# Patient Record
Sex: Male | Born: 1935 | Race: White | Hispanic: No | Smoking: Never smoker
Health system: Southern US, Community
[De-identification: ages and names within clinical notes are randomized; demographics above are authoritative.]

## PROBLEM LIST (undated history)

## (undated) DIAGNOSIS — I509 Heart failure, unspecified: Secondary | ICD-10-CM

## (undated) DIAGNOSIS — I10 Essential (primary) hypertension: Secondary | ICD-10-CM

## (undated) HISTORY — PX: VALVE REPLACEMENT: SUR13

## (undated) HISTORY — PX: CARDIAC SURGERY: SHX584

## (undated) SURGERY — ECHOCARDIOGRAM, TRANSESOPHAGEAL
Anesthesia: Moderate Sedation | Laterality: Right

---

## 2010-06-01 ENCOUNTER — Emergency Department: Payer: Self-pay | Admitting: Emergency Medicine

## 2010-12-28 ENCOUNTER — Ambulatory Visit: Payer: Self-pay | Admitting: Family Medicine

## 2014-01-26 ENCOUNTER — Observation Stay: Payer: Self-pay | Admitting: Family Medicine

## 2014-01-26 LAB — CBC WITH DIFFERENTIAL/PLATELET
BASOS ABS: 0.1 10*3/uL (ref 0.0–0.1)
Basophil %: 1 %
EOS PCT: 4.4 %
Eosinophil #: 0.3 10*3/uL (ref 0.0–0.7)
HCT: 35.8 % — ABNORMAL LOW (ref 40.0–52.0)
HGB: 11.5 g/dL — ABNORMAL LOW (ref 13.0–18.0)
Lymphocyte #: 1.8 10*3/uL (ref 1.0–3.6)
Lymphocyte %: 30.4 %
MCH: 27.7 pg (ref 26.0–34.0)
MCHC: 32.3 g/dL (ref 32.0–36.0)
MCV: 86 fL (ref 80–100)
Monocyte #: 0.6 x10 3/mm (ref 0.2–1.0)
Monocyte %: 10.2 %
Neutrophil #: 3.2 10*3/uL (ref 1.4–6.5)
Neutrophil %: 54 %
PLATELETS: 186 10*3/uL (ref 150–440)
RBC: 4.16 10*6/uL — ABNORMAL LOW (ref 4.40–5.90)
RDW: 14.6 % — ABNORMAL HIGH (ref 11.5–14.5)
WBC: 5.9 10*3/uL (ref 3.8–10.6)

## 2014-01-26 LAB — COMPREHENSIVE METABOLIC PANEL
ALK PHOS: 44 U/L — AB
ALT: 24 U/L (ref 12–78)
ANION GAP: 4 — AB (ref 7–16)
AST: 25 U/L (ref 15–37)
Albumin: 3.7 g/dL (ref 3.4–5.0)
BUN: 28 mg/dL — AB (ref 7–18)
Bilirubin,Total: 0.3 mg/dL (ref 0.2–1.0)
CALCIUM: 8.7 mg/dL (ref 8.5–10.1)
Chloride: 105 mmol/L (ref 98–107)
Co2: 27 mmol/L (ref 21–32)
Creatinine: 1.18 mg/dL (ref 0.60–1.30)
EGFR (Non-African Amer.): 56 — ABNORMAL LOW
Glucose: 92 mg/dL (ref 65–99)
Osmolality: 277 (ref 275–301)
POTASSIUM: 4 mmol/L (ref 3.5–5.1)
SODIUM: 136 mmol/L (ref 136–145)
Total Protein: 7.4 g/dL (ref 6.4–8.2)

## 2014-01-26 LAB — URINALYSIS, COMPLETE
Bacteria: NONE SEEN
Bilirubin,UR: NEGATIVE
Blood: NEGATIVE
Glucose,UR: NEGATIVE mg/dL (ref 0–75)
Hyaline Cast: 3
Ketone: NEGATIVE
Leukocyte Esterase: NEGATIVE
Nitrite: NEGATIVE
Ph: 6 (ref 4.5–8.0)
Protein: NEGATIVE
SPECIFIC GRAVITY: 1.014 (ref 1.003–1.030)
SQUAMOUS EPITHELIAL: NONE SEEN

## 2014-01-26 LAB — PROTIME-INR
INR: 2.5
Prothrombin Time: 26.3 secs — ABNORMAL HIGH (ref 11.5–14.7)

## 2014-01-26 LAB — CK TOTAL AND CKMB (NOT AT ARMC)
CK, TOTAL: 321 U/L — AB
CK, Total: 370 U/L — ABNORMAL HIGH
CK, Total: 366 U/L — ABNORMAL HIGH
CK-MB: 12.2 ng/mL — AB (ref 0.5–3.6)
CK-MB: 11.8 ng/mL — AB (ref 0.5–3.6)
CK-MB: 10.4 ng/mL — AB (ref 0.5–3.6)

## 2014-01-26 LAB — TROPONIN I
TROPONIN-I: 0.02 ng/mL
Troponin-I: <0.02 ng/mL
Troponin-I: <0.02 ng/mL

## 2015-03-05 NOTE — Discharge Summary (Signed)
PATIENT NAME:  Dean Cruz, Dean Cruz MR#:  [id number] DATE OF BIRTH:  06-Jan-1927  DATE OF ADMISSION:  01/26/2014 DATE OF DISCHARGE:  01/26/2014  REASON FOR ADMISSION: Chest pain.  ADMISSION DIAGNOSES:  1. Angina. 2. Hypertension. 3. Hyperlipidemia  4. Benign prostatic hypertrophy. 5. Mitral valve replacement. 6. Coronary artery disease, status post stent in 2002. 7. Former smoker. 8. Hyperthyroidism.  DISPOSITION: Home.  MEDICATIONS AT DISCHARGE: , aspirin 81 mg daily, lisinopril 40 mg daily, warfarin 2.5 mg daily,  fish oil 100 mg twice daily, Synthroid 100 mcg once daily, ascorbic acid 100 mg once daily.    HOSPITAL COURSE:  This is a very nice 81 year old gentleman with history of coronary artery disease, hypertension, hyperlipidemia, BPH, previous mitral valve replacement in 2008 on chronic anticoagulation, presented to the emergency department with a history of chest pain associated with shortness of breath and diaphoresis. The patient has been having some pressure on his chest on and off   for the past 2 weeks. The patient has significant pressure in his chest when he works. He was admitted to the emergency department.  The patient was evaluated by Dr. Welton FlakesKhan who decided to observe with cardiac enzimes  and do a stress test as a outpatient.  The patient had 2 sets of cardiac enzymes that were negative, and overall his laboratory was in normal range. His INR was 2.5. His hemoglobin was 11.5.  His white blood cells were 5.9.  His BUN was 28, creatinine 1.18.  The patient was discharged in good condition with general recommendations.  Prescription of nitroglycerin given to the patient.  I spent about 45 minutes evaluating the patient and getting him discharged.  ____________________________ Felipa Furnaceoberto Sanchez Gutierrez, MD rsg:sg D: 01/30/2014 23:25:00 ET T: 01/31/2014 09:29:47 ET JOB#: [id number]  cc: Felipa Furnaceoberto Sanchez Gutierrez, MD, <Dictator> Addison Freimuth Juanda ChanceSANCHEZ GUTIERRE MD ELECTRONICALLY  SIGNED 02/07/2014 13:31

## 2015-03-05 NOTE — Consult Note (Signed)
PATIENT NAME:  Dean Cruz, Dean MR#:  [id number] DATE OF BIRTH:  07-Sep-1927  CARDIOLOGY CONSULTATION   DATE OF CONSULTATION:  01/26/2014  CONSULTING PHYSICIAN:  Laurier NancyShaukat A. Janai Maudlin, MD  INDICATION FOR THE CONSULTATION: Chest pain.   HISTORY OF PRESENT ILLNESS: This is an 81 year old pleasant white male with a past medical history of coronary artery disease, status post PCI x2, history of having that done in 2002, and then had mitral valve replacement in 2008 also, who presented to the Emergency Room with pressure-type chest pain associated with shortness of breath and diaphoresis. He says that he has been having this pressure in his chest intermittently for the past couple of weeks. Whenever he goes to the bathroom, he gets pressure, and walking just within the house, he gets pressure in the chest. He was a little concerned when it would not go away, and that is why he came into the Emergency Room last night.    OTHER PAST MEDICAL HISTORY: History of hypertension, hyperlipidemia, BPH. As mentioned, had PCI and stenting in 2002 and mitral valve replacement in 2008.   ALLERGIES: None.   FAMILY HISTORY: His father had MI at age 79.   MEDICATIONS:  1. Aspirin 81 mg.  2. Lisinopril 40 mg.  3. Warfarin 2.5 mg.  4. Hydrochlorothiazide 25 mg.  5. Synthroid.  6. Ascorbic acid.   SOCIAL HISTORY: Denied EtOH abuse or smoking.   PHYSICAL EXAMINATION:  GENERAL: His pulse was 56, respirations were 19, blood pressure was 168/79.  NECK: Revealed no JVD.  LUNGS: Clear.  HEART: Regular rate and rhythm. Normal S1, S2. No audible murmur.  ABDOMEN: Soft, nontender. Positive bowel sounds.  EXTREMITIES: No pedal edema.  NEUROLOGICAL: Appears to be intact.   LABORATORY DATA: His INR was 2.5. His troponins both sets were negative, but CPK was slightly elevated. CPK was 366 and 370 and MB fraction was 12.2 and 11.8; however, troponins were both negative. BUN and creatinine is 28/1.18. The rest of the  electrolytes look normal.   ASSESSMENT AND PLAN: Appears like anginal type of chest pain. EKG had no acute changes. [facility]. He actually had [facility] and PCI and stent[facility] at the [facility], but lives in [place], and because of closeness to the hospital, he came here. He has ruled out for myocardial infarction except for the last set of cardiac enzymes. Advise discharging after the third set, and will set him up for a stress Cardiolite tomorrow in the office at 8:30.   Thank you very much for referral.   ____________________________ Laurier NancyShaukat A. Shamar Kracke, MD sak:lb D: 01/26/2014 08:34:22 ET T: 01/26/2014 08:53:59 ET JOB#: [id number]  cc: Laurier NancyShaukat A. Evellyn Tuff, MD, <Dictator> Laurier NancySHAUKAT A Telisha Zawadzki MD ELECTRONICALLY SIGNED 02/24/2014 13:56

## 2015-03-05 NOTE — H&P (Signed)
PATIENT NAME:  Dean Cruz, Dean Cruz MR#:  [id number] DATE OF BIRTH:  1927/01/06  DATE OF ADMISSION:  01/26/2014  REFERRING PHYSICIAN: Marjean Donna, MD  PRIMARY CARE PHYSICIAN: The patient is following with [place]: Chest pain.   HISTORY OF PRESENT ILLNESS: This is an 81 year old male with known history of coronary artery disease status post cardiac stent in 7416 and metallic mitral valve replacement in 2008 who presents with complaint of chest pain. Reports his pain has been intermittent over the last week, is referred as midsternal, pressure quality, and nonradiating provoked by exertion, relieved by rest. He denies any nausea, vomiting, diaphoresis, shortness of breath, palpitation or dizziness accompanying the chest pain. Denies any such previous chest pain in the past. Currently denies any chest pain. The patient's first troponin was negative. The patient's EKG did show T wave inversion in the anterior lateral leads, but we do not have a baseline for his EKG as this is his first time visit. As well, the patient is known to have mitral metallic valve replacement. He is on warfarin with therapeutic INR of 2.5. So far the patient had 2 negative troponins, less than 0.02.   PAST MEDICAL HISTORY: 1.  Hypertension.  2.  Hyperlipidemia.  3.  BPH. 4.  Coronary artery disease status post cardiac stent in 2002.  5.  Mitral valve replacement in 2008.   FAMILY HISTORY: He quit smoking more than 50 years ago. No alcohol. No illicit drug use. Denies any family history of coronary artery disease at a young age. Reports his father had a heart attack at age of 16.   ALLERGIES: No known drug allergies.   HOME MEDICATIONS: 1.  Finasteride 5 mg oral daily. 2.  Aspirin 81 mg oral daily.  3.  Lisinopril 40 mg oral daily.  4.  Warfarin 2.5 mg oral daily.  5.  Hydrochlorothiazide 25 mg oral daily. 6.  Fish oil 1000 mg oral 2 times a day.  7.  Synthroid 1000 mcg oral daily.  8.   Ascorbic acid 1000 mg oral daily.   REVIEW OF SYSTEMS: CONSTITUTIONAL: Denies fever, chills, fatigue, weakness.  EYES: Denies blurry vision, double vision, inflammation, glaucoma, tinnitus, ear pain, hearing loss, epistaxis or discharge.  RESPIRATORY: Denies cough, wheezing, hemoptysis, dyspnea, COPD. CARDIOVASCULAR: Reports chest pain, currently resolved. No edema. No palpitation. No syncope.  GASTROINTESTINAL: Denies nausea, vomiting, diarrhea, abdominal pain, hematemesis, melena.  GENITOURINARY: Denies dysuria, hematuria, or renal colic.  ENDOCRINE: Denies polyuria, polydipsia, heat or cold intolerance.  HEMATOLOGY: Denies anemia, easy bruising, bleeding diathesis.  INTEGUMENT: Denies acne, rash or skin lesion.  MUSCULOSKELETAL: Denies any neck pain, arthritis, cramps, swelling, gout.  NEUROLOGIC: Denies CVA, TIA, dementia, headache, ataxia, vertigo.  PSYCHIATRIC: Denies anxiety, insomnia, bipolar disorder or schizophrenia.   PHYSICAL EXAMINATION: VITAL SIGNS: Temperature 97.9, pulse 44, respiratory rate 14, blood pressure 144/70, saturating 97% on room air.  GENERAL: Well-nourished male who looks comfortable in bed, in no apparent distress.  HEENT: Head atraumatic, normocephalic. Pupils equal and reactive to light. Pink conjunctivae. Anicteric sclerae. Moist oral mucosa.  NECK: Supple. No thyromegaly. No JVD.  CHEST: Good air entry bilaterally. No wheezing, rales, rhonchi.  CARDIOVASCULAR: S1, S2 heard. No rubs. Has midsystolic click.  ABDOMEN: Soft, nontender, nondistended. Bowel sounds present.  EXTREMITIES: No edema. No clubbing. No cyanosis. Pedal and radial pulses are +2 bilaterally.  PSYCHIATRIC: Appropriate affect. Awake and alert x3. Intact judgment and insight.  MUSCULOSKELETAL: No joint effusion or erythema.  SKIN: Normal  skin turgor. Warm and dry.   DIAGNOSTIC DATA: Pertinent labs: Glucose 92, BUN 28, creatinine 1.18, sodium 136, potassium 4, chloride 105, CO2 27, ALT 24,  AST 25, alk phos 44. Troponin less than 0.02 x2. White blood cell 5.9, hemoglobin 11.5, hematocrit 35.8, platelets 186,000. INR 2.5. Urinalysis negative for leukocyte esterase and nitrite.   Chest x-ray: No acute disease.   ASSESSMENT AND PLAN: 1.  Chest pain, currently resolved. The patient is known to have history of coronary artery disease so he will be admitted to telemetry floor for further evaluation. So far he has 2 sets of negative cardiac enzymes. We will obtain a total of 3 sets. The patient has EKG changes, but unclear if these are old or new. Will request his record from [facility]. As well, we will consult cardiology service for further evaluation and see if there is any further work-up indicated at this point. He was already given 324 mg of aspirin in the ED. He is already on anticoagulation with therapeutic INR of 2.5.  2.  Hypertension. Blood pressure acceptable. Continue with home medication.  3.  Hyperlipidemia. Continue with fish oil.  4.  Benign prostatic hypertrophy. Continue with finasteride.  5.  Metallic mitral valve. The patient is on warfarin with therapeutic INR.  6.  We will consult pharmacy to dose his warfarin.  7.  Deep vein thrombosis prophylaxis. The patient has full dose anticoagulation with warfarin.   CODE STATUS: The patient reports he is a FULL code.   TOTAL TIME SPENT ON ADMISSION AND PATIENT CARE: 55 minutes.   ____________________________ Albertine Patricia, MD dse:sb D: 01/26/2014 07:21:34 ET T: 01/26/2014 08:11:31 ET JOB#: [id number]  cc: Albertine Patricia, MD, <Dictator> Destony Prevost Graciela Husbands MD ELECTRONICALLY SIGNED 02/01/2014 1:39

## 2017-11-26 ENCOUNTER — Encounter: Payer: Self-pay | Admitting: Emergency Medicine

## 2017-11-26 ENCOUNTER — Other Ambulatory Visit: Payer: Self-pay

## 2017-11-26 ENCOUNTER — Emergency Department: Payer: Medicare Other

## 2017-11-26 ENCOUNTER — Emergency Department
Admission: EM | Admit: 2017-11-26 | Discharge: 2017-11-26 | Disposition: A | Discharge status: Home / Self Care | Payer: Medicare Other | Attending: Emergency Medicine | Admitting: Emergency Medicine

## 2017-11-26 DIAGNOSIS — R531 Weakness: Secondary | ICD-10-CM | POA: Diagnosis not present

## 2017-11-26 DIAGNOSIS — I110 Hypertensive heart disease with heart failure: Secondary | ICD-10-CM | POA: Diagnosis not present

## 2017-11-26 DIAGNOSIS — R110 Nausea: Secondary | ICD-10-CM | POA: Insufficient documentation

## 2017-11-26 DIAGNOSIS — Z79899 Other long term (current) drug therapy: Secondary | ICD-10-CM | POA: Diagnosis not present

## 2017-11-26 DIAGNOSIS — R42 Dizziness and giddiness: Secondary | ICD-10-CM | POA: Diagnosis present

## 2017-11-26 DIAGNOSIS — I509 Heart failure, unspecified: Secondary | ICD-10-CM | POA: Insufficient documentation

## 2017-11-26 DIAGNOSIS — E860 Dehydration: Secondary | ICD-10-CM

## 2017-11-26 DIAGNOSIS — Z954 Presence of other heart-valve replacement: Secondary | ICD-10-CM | POA: Diagnosis not present

## 2017-11-26 DIAGNOSIS — Z7901 Long term (current) use of anticoagulants: Secondary | ICD-10-CM | POA: Diagnosis not present

## 2017-11-26 DIAGNOSIS — R860 Abnormal level of enzymes in specimens from male genital organs: Secondary | ICD-10-CM | POA: Diagnosis not present

## 2017-11-26 HISTORY — DX: Essential (primary) hypertension: I10

## 2017-11-26 HISTORY — DX: Heart failure, unspecified: I50.9

## 2017-11-26 LAB — BASIC METABOLIC PANEL
ANION GAP: 6 (ref 5–15)
BUN: 32 mg/dL — ABNORMAL HIGH (ref 6–20)
CO2: 24 mmol/L (ref 22–32)
CREATININE: 1.2 mg/dL (ref 0.61–1.24)
Calcium: 8.4 mg/dL — ABNORMAL LOW (ref 8.9–10.3)
Chloride: 108 mmol/L (ref 101–111)
GFR, EST NON AFRICAN AMERICAN: 52 mL/min — AB (ref >60)
Glucose, Bld: 126 mg/dL — ABNORMAL HIGH (ref 65–99)
Potassium: 4.2 mmol/L (ref 3.5–5.1)
Sodium: 138 mmol/L (ref 135–145)

## 2017-11-26 LAB — CBC
HCT: 34.2 % — ABNORMAL LOW (ref 40.0–52.0)
HEMOGLOBIN: 11.7 g/dL — AB (ref 13.0–18.0)
MCH: 30.8 pg (ref 26.0–34.0)
MCHC: 34 g/dL (ref 32.0–36.0)
MCV: 90.6 fL (ref 80.0–100.0)
PLATELETS: 196 10*3/uL (ref 150–440)
RBC: 3.78 MIL/uL — AB (ref 4.40–5.90)
RDW: 14.7 % — ABNORMAL HIGH (ref 11.5–14.5)
WBC: 7.9 10*3/uL (ref 3.8–10.6)

## 2017-11-26 LAB — TROPONIN I

## 2017-11-26 MED ORDER — SODIUM CHLORIDE 0.9 % IV BOLUS (SEPSIS)
500.0000 mL | Freq: Once | INTRAVENOUS | Status: AC
  Administered 2017-11-26: 500 mL via INTRAVENOUS

## 2017-11-26 NOTE — ED Notes (Signed)
Pt was able to successfully urinate Sent to lab  Lm edt

## 2017-11-26 NOTE — ED Notes (Signed)
ED Provider at bedside. 

## 2017-11-26 NOTE — ED Triage Notes (Signed)
Pt bib [facility] from home where he klives with daughter. Pt reports waking approx 0130, but feeing weak and dizzy upon standing. Report from EMS is that pt and daughter drove home from [place] today, ate tuna for dinner, no nausea present until waking during the night.

## 2017-11-26 NOTE — ED Provider Notes (Signed)
[facility] Emergency Department Provider Note    First MD Initiated Contact with Patient 11/26/17 [phone]     (approximate)  I have reviewed the triage vital signs and the nursing notes.   HISTORY  Chief Complaint Weakness and Nausea   HPI Dean Cruz is a 84 y.o. male with below list of chronic medical conditions including mitral valve replacement CHF and hypertension presents to the emergency department with acute onset of dizziness on standing tonight.  Patient states that he stood up to go to the bathroom and acutely became dizzy with generalized weakness.  Patient denies any vomiting or diarrhea.  Patient denies any chest pain no shortness of breath.  Patient states that symptoms have resolved at this time.  Patient states only liquid ingestion yesterday was coffee times 4 cups.    Past Medical History:  Diagnosis Date  . CHF (congestive heart failure) (HCC)   . Hypertension     There are no active problems to display for this patient.   Past Surgical History:  Procedure Laterality Date  . CARDIAC SURGERY    . VALVE REPLACEMENT      Prior to Admission medications   Medication Sig Start Date End Date Taking? Authorizing Provider  amLODipine (NORVASC) 10 MG tablet Take 5 mg by mouth daily.   Yes [provider]  ferrous sulfate 325 (65 FE) MG EC tablet Take 324 mg by mouth daily with breakfast.   Yes [provider]  finasteride (PROSCAR) 5 MG tablet Take 5 mg by mouth daily.   Yes [provider]  levothyroxine (SYNTHROID, LEVOTHROID) 112 MCG tablet Take 112 mcg by mouth daily before breakfast.   Yes [provider]  lisinopril (PRINIVIL,ZESTRIL) 40 MG tablet Take 40 mg by mouth daily.   Yes [provider]  metoprolol succinate (TOPROL-XL) 25 MG 24 hr tablet Take 25 mg by mouth daily.   Yes [provider]  rosuvastatin (CRESTOR) 40 MG tablet Take 40 mg by mouth at bedtime.   Yes  [provider]  warfarin (COUMADIN) 2.5 MG tablet Take 2.5 mg by mouth daily. Take one tablet by mouth every day, except take one-half tablet Wednesday and Friday   Yes [provider]    Allergies No known drug allergies No family history on file.  Social History Social History   Tobacco Use  . Smoking status: Never Smoker  . Smokeless tobacco: Never Used  Substance Use Topics  . Alcohol use: No    Frequency: Never  . Drug use: No    Review of Systems Constitutional: No fever/chills Eyes: No visual changes. ENT: No sore throat. Cardiovascular: Denies chest pain. Respiratory: Denies shortness of breath. Gastrointestinal: No abdominal pain.  No nausea, no vomiting.  No diarrhea.  No constipation. Genitourinary: Negative for dysuria. Musculoskeletal: Negative for neck pain.  Negative for back pain. Integumentary: Negative for rash. Neurological: Negative for headaches, focal weakness or numbness.  Positive for dizziness with position change  ____________________________________________   PHYSICAL EXAM:  VITAL SIGNS: ED Triage Vitals  Enc Vitals Group     BP 11/26/17 0318 (!) 184/78     Pulse Rate 11/26/17 0318 (!) 55     Resp 11/26/17 0318 14     Temp 11/26/17 0318 97.6 F (36.4 C)     Temp Source 11/26/17 0318 Oral     SpO2 11/26/17 0314 98 %     Weight 11/26/17 0319 81.6 kg (180 lb)     Height  11/26/17 0319 1.676 m (5\' 6" )     Head Circumference --      Peak Flow --      Pain Score --      Pain Loc --      Pain Edu? --      Excl. in GC? --     Constitutional: Alert and oriented. Well appearing and in no acute distress. Eyes: Conjunctivae are normal. PERRL. EOMI. Head: Atraumatic. Mouth/Throat: Mucous membranes are moist.  Oropharynx non-erythematous. Neck: No stridor.   Cardiovascular: Normal rate, regular rhythm. Good peripheral circulation. Grossly normal heart sounds. Respiratory: Normal respiratory effort.  No retractions. Lungs  CTAB. Gastrointestinal: Soft and nontender. No distention.  Musculoskeletal: No lower extremity tenderness nor edema. No gross deformities of extremities. Neurologic:  Normal speech and language. No gross focal neurologic deficits are appreciated.  Skin:  Skin is warm, dry and intact. No rash noted. Psychiatric: Mood and affect are normal. Speech and behavior are normal.  ____________________________________________   LABS (all labs ordered are listed, but only abnormal results are displayed)  Labs Reviewed  BASIC METABOLIC PANEL - Abnormal; Notable for the following components:      Result Value   Glucose, Bld 126 (*)    BUN 32 (*)    Calcium 8.4 (*)    GFR calc non Af Amer 52 (*)    All other components within normal limits  CBC - Abnormal; Notable for the following components:   RBC 3.78 (*)    Hemoglobin 11.7 (*)    HCT 34.2 (*)    RDW 14.7 (*)    All other components within normal limits  TROPONIN I   ____________________________________________  EKG  ED ECG REPORT I, [place] N Maki Hege, the attending physician, personally viewed and interpreted this ECG.   Date: 11/26/2017  EKG Time: 3:14 AM  Rate: 58  Rhythm: Sinus bradycardia with premature ventricular contraction  Axis: Normal  Intervals: Normal  ST&T Change: None  ____________________________________________  RADIOLOGY I, [place] N Nikkita Adeyemi, personally viewed and evaluated these images (plain radiographs) as part of my medical decision making, as well as reviewing the written report by the radiologist.  Ct Head Wo Contrast  Result Date: 11/26/2017 CLINICAL DATA:  Weakness and dizziness when standing beginning at 0130 hours. EXAM: CT HEAD WITHOUT CONTRAST TECHNIQUE: Contiguous axial images were obtained from the base of the skull through the vertex without intravenous contrast. COMPARISON:  CT HEAD June 01, 2010 FINDINGS: BRAIN: No intraparenchymal hemorrhage, mass effect nor midline shift. The ventricles and  sulci are normal for age. Patchy supratentorial white matter hypodensities less than expected for patient's age, though non-specific are most compatible with chronic small vessel ischemic disease. 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 No acute large vascular territory infarcts. No abnormal extra-axial fluid collections. Basal cisterns are patent. VASCULAR: Mild calcific atherosclerosis of the carotid siphons. SKULL: No skull fracture. No significant scalp soft tissue swelling. SINUSES/ORBITS: The mastoid air-cells and included paranasal sinuses are well-aerated.The included ocular globes and orbital contents are non-suspicious. Status post bilateral ocular lens implants. OTHER: None. IMPRESSION: 1. No acute intracranial process. 2. 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 Electronically Signed   By: Awilda Metro M.D.   On: 11/26/2017 04:58      Procedures   ____________________________________________   INITIAL IMPRESSION / ASSESSMENT AND PLAN / ED COURSE  As part of my medical decision making, I reviewed the following data within the electronic MEDICAL RECORD NUMBER11 year old male  presented with above-stated history and physical exam concerning for orthostatic hypotension most likely secondary to dehydration versus CVA or potential arrhythmia.  CT scan of the head revealed no acute findings.  Laboratory data remarkable for a BUN of 32 with a creatinine of 1.20.  Patient given IV normal saline 1 L.  Patient states that he is feeling better at this time.  Patient and family informed of all clinical findings including old basilar lacunar infarct noted on CT. ____________________________________________  FINAL CLINICAL IMPRESSION(S) / ED DIAGNOSES  Final diagnoses:  Dehydration     MEDICATIONS GIVEN DURING THIS VISIT:  Medications  sodium chloride 0.9 % bolus 500 mL (500 mLs Intravenous New Bag/Given 11/26/17 0402)     ED Discharge Orders     None       Note:  This document was prepared using Dragon voice recognition software and may include unintentional dictation errors.    Darci CurrentBrown, [place] [address] [place] [address], MD 11/26/17 [phone]

## 2017-11-26 NOTE — ED Notes (Signed)
Attempting to urinate with urinal.

## 2017-11-26 NOTE — ED Provider Notes (Signed)
Signout from Dr. Manson PasseyBrown in this 84 year old male who presented with dizziness but apparent dehydration earlier today.  Patient received fluids and then I will reassess him as the plan per Dr. Manson PasseyBrown.  Physical Exam  BP (!) 143/65   Pulse 61   Temp 97.6 F (36.4 C) (Oral)   Resp 13   Ht 5\' 6"  (1.676 m)   Wt 81.6 kg (180 lb)   SpO2 98%   BMI 29.05 kg/m  ----------------------------------------- 7:47 AM on 11/26/2017 -----------------------------------------   Physical Exam Patient without any distress.  Resting comfortably.  Able to ambulate without any assistance and able to walk straight.  Also able to move his head in all directions without provoking vertigo/dizziness. ED Course/Procedures     Procedures  MDM  Patient with improvement of symptoms without any dizziness at rest or with movement of his head.  Able to ambulate without assistance at his baseline level of ambulation.  Patient to be discharged at this time.       Myrna BlazerSchaevitz, Shaneil Yazdi Matthew, MD 11/26/17 [phone]

## 2018-03-20 ENCOUNTER — Other Ambulatory Visit: Payer: Self-pay | Admitting: Physician Assistant

## 2018-03-20 DIAGNOSIS — I059 Rheumatic mitral valve disease, unspecified: Secondary | ICD-10-CM

## 2018-03-20 DIAGNOSIS — R0602 Shortness of breath: Secondary | ICD-10-CM

## 2018-03-28 ENCOUNTER — Ambulatory Visit
Admission: RE | Admit: 2018-03-28 | Discharge: 2018-03-28 | Disposition: A | Discharge status: Home / Self Care | Payer: No Typology Code available for payment source | Source: Ambulatory Visit | Attending: Physician Assistant | Admitting: Physician Assistant

## 2018-03-28 DIAGNOSIS — R0602 Shortness of breath: Secondary | ICD-10-CM | POA: Insufficient documentation

## 2018-03-28 DIAGNOSIS — I110 Hypertensive heart disease with heart failure: Secondary | ICD-10-CM | POA: Diagnosis not present

## 2018-03-28 DIAGNOSIS — I059 Rheumatic mitral valve disease, unspecified: Secondary | ICD-10-CM | POA: Insufficient documentation

## 2018-03-28 DIAGNOSIS — I509 Heart failure, unspecified: Secondary | ICD-10-CM | POA: Diagnosis not present

## 2018-03-28 NOTE — Progress Notes (Signed)
*  PRELIMINARY RESULTS* Echocardiogram 2D Echocardiogram has been performed.  Dean Cruz 03/28/2018, 11:19 AM

## 2018-09-04 IMAGING — CT CT HEAD W/O CM
3 series · 15 of 47 positions shown, 18 images · non-contrast
Comparison: CT HEAD June 01, 2010

CLINICAL DATA: Weakness and dizziness when standing beginning at
3783 hours.

EXAM:
CT HEAD WITHOUT CONTRAST
TECHNIQUE: Contiguous axial images were obtained from the base of the skull
through the vertex without intravenous contrast.

[Series 2: head wo · axial · 0.42mm/px · z∈[-140,-15]mm · 9 of 30 slices shown, 12 images]
[im 3/30  brain]
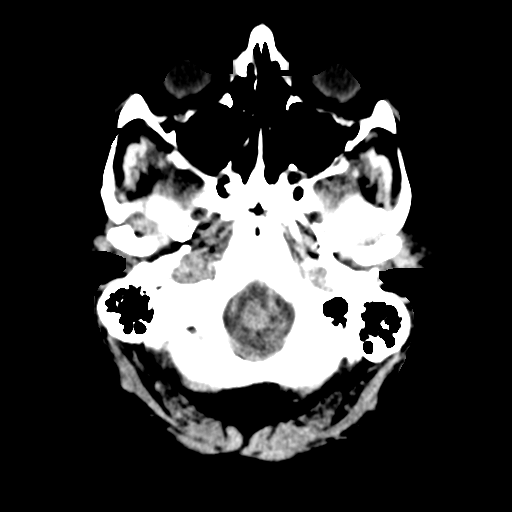
[im 3/30  bone]
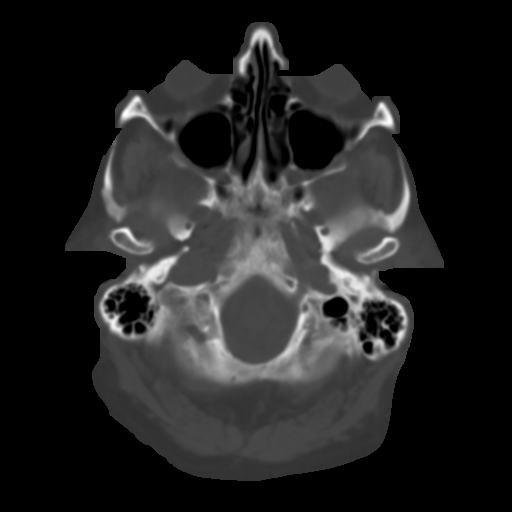
[im 6/30  brain]
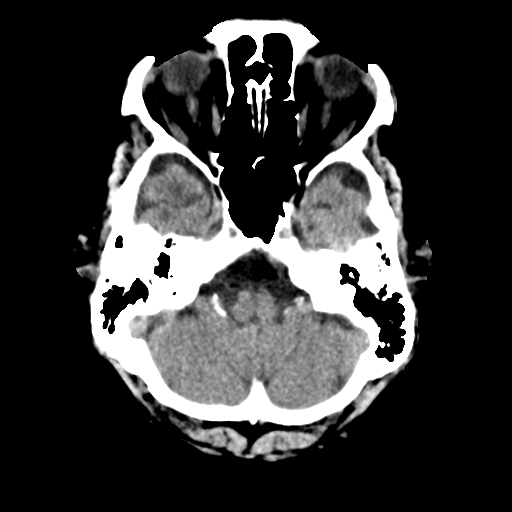
[im 9/30  brain]
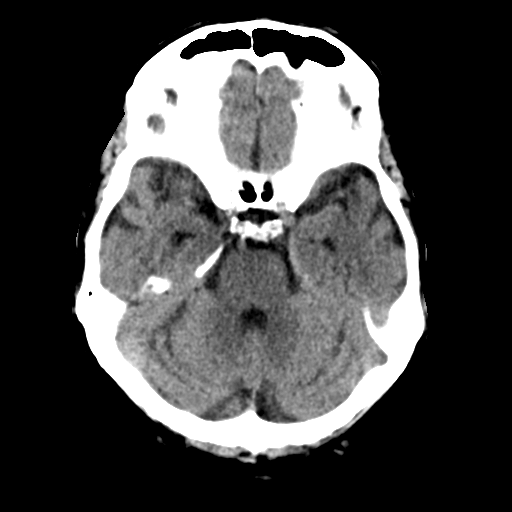
[im 12/30  brain]
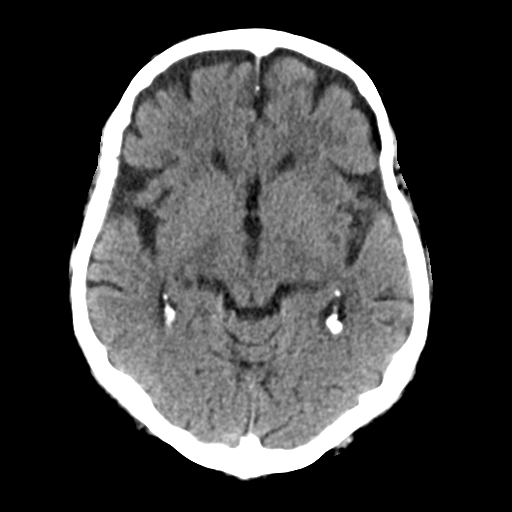
[im 16/30  brain]
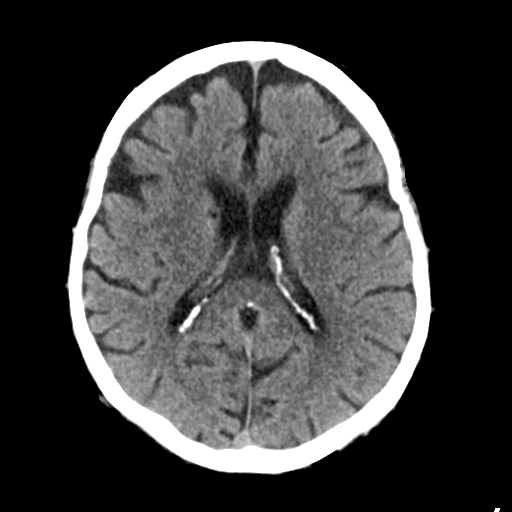
[im 16/30  bone]
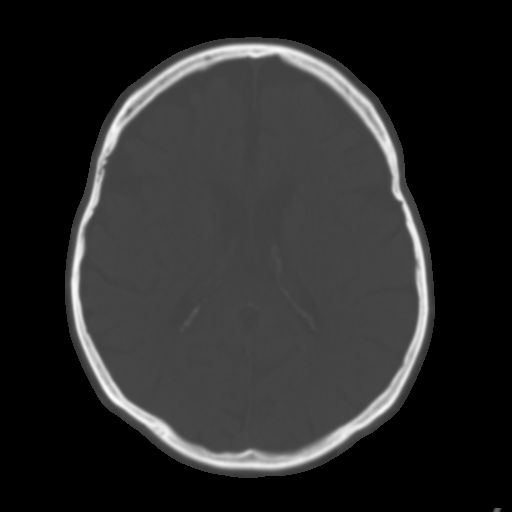
[im 19/30  brain]
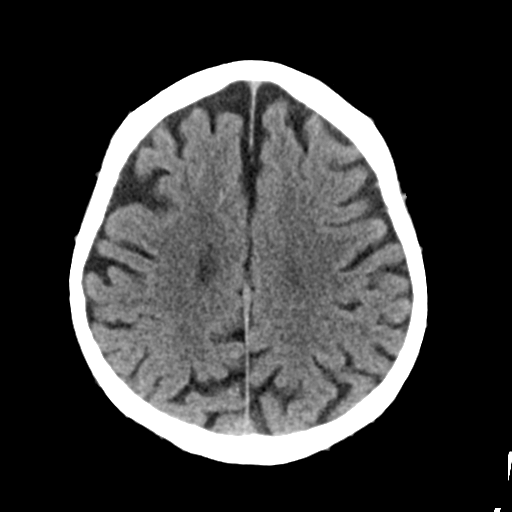
[im 22/30  brain]
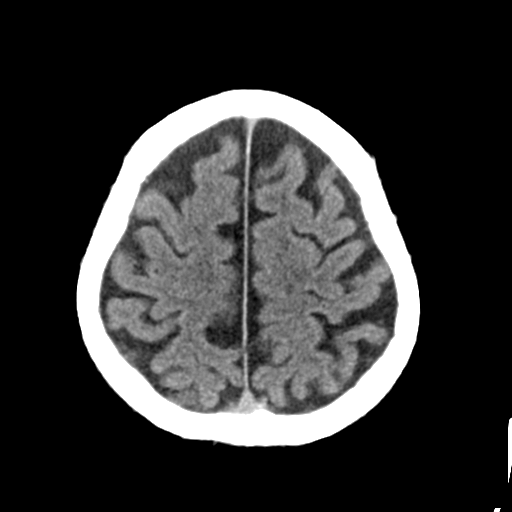
[im 25/30  brain]
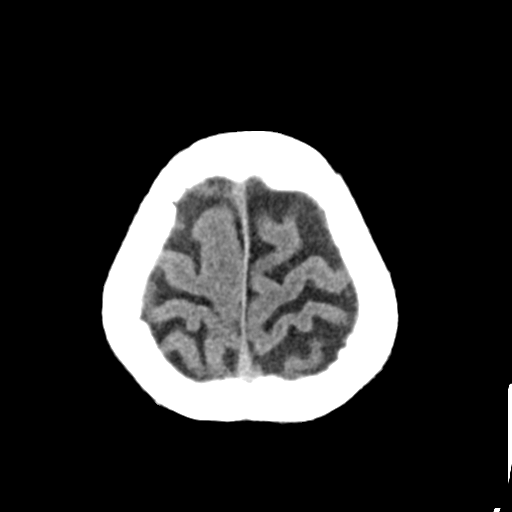
[im 28/30  brain]
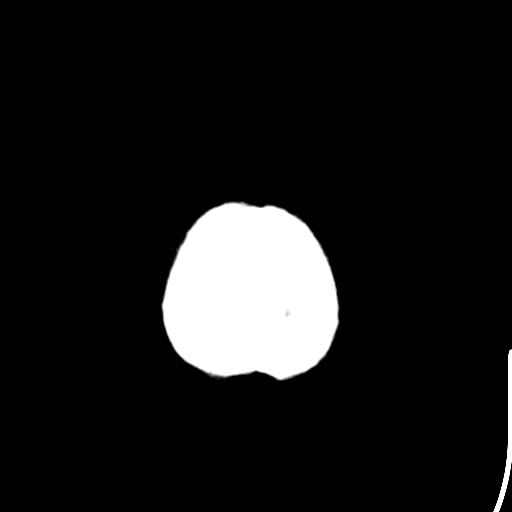
[im 28/30  bone]
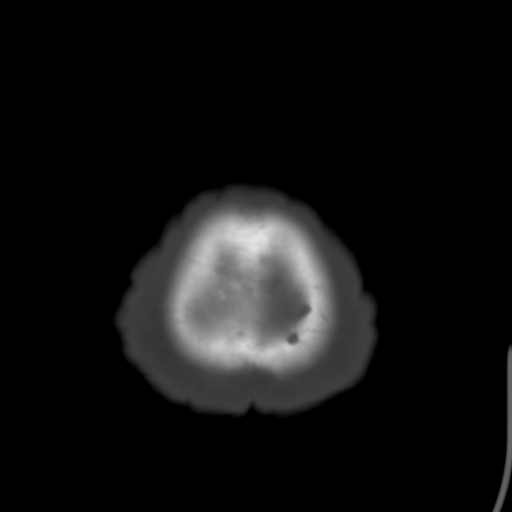

[Series 4: coronal soft tissue · coronal · 0.30mm/px · 3 of 69 slices shown]
[im 23/69  brain]
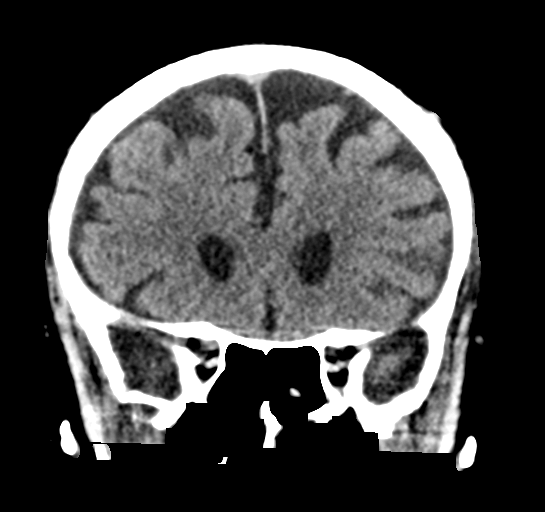
[im 31/69  brain]
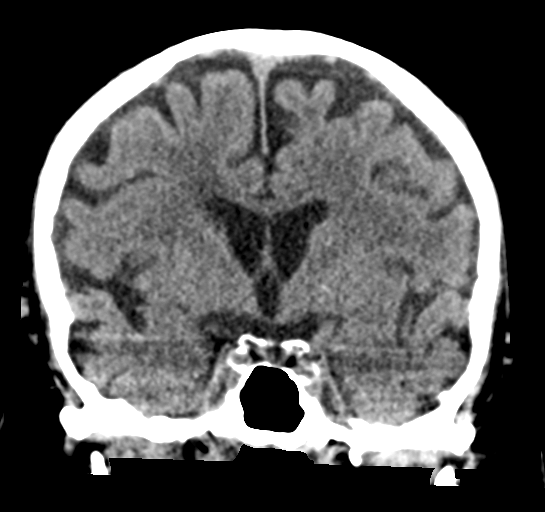
[im 38/69  brain]
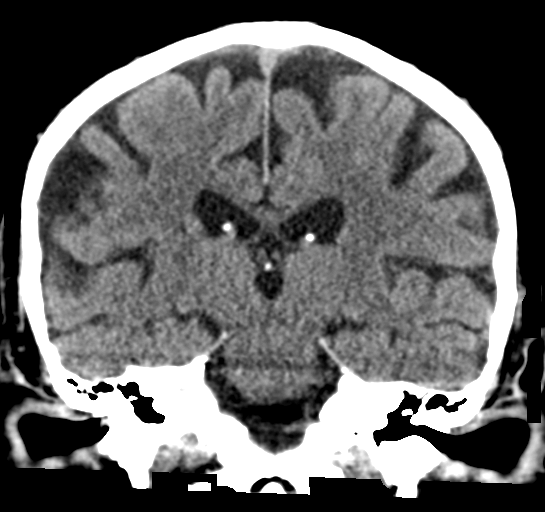

[Series 5: sagittal soft tissue · sagittal · 0.30mm/px · 3 of 56 slices shown]
[im 19/56  brain]
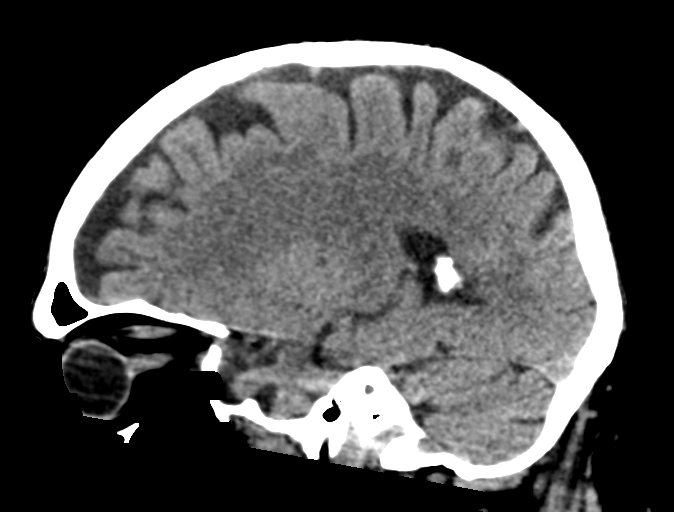
[im 28/56  brain]
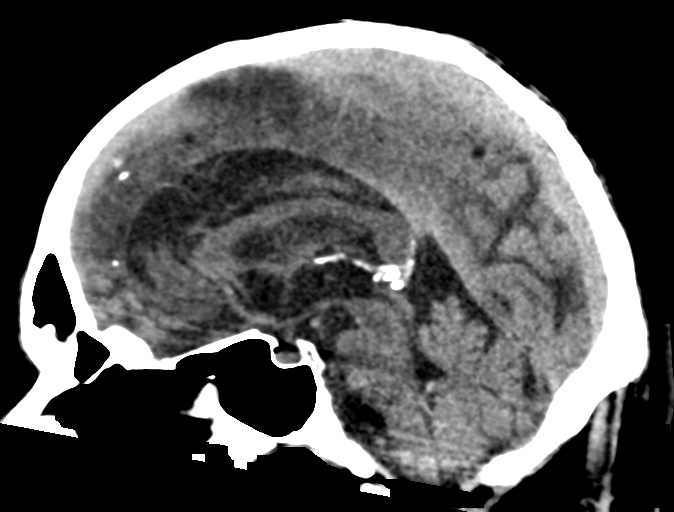
[im 37/56  brain]
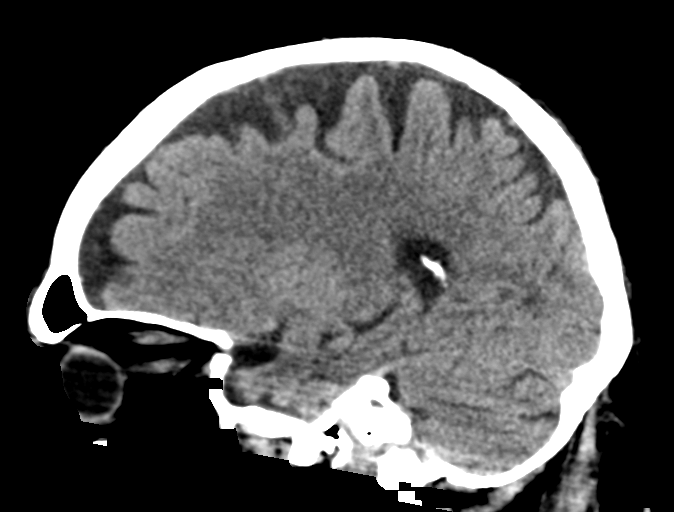

[15 of 47 positions shown; findings below may reference images not displayed]

FINDINGS: BRAIN: No intraparenchymal hemorrhage, mass effect nor midline
shift. The ventricles and sulci are normal for age. Patchy
supratentorial white matter hypodensities less than expected for
patient's age, though non-specific are most compatible with chronic
small vessel ischemic disease. Old RIGHT caudate lacunar infarct. No
acute large vascular territory infarcts. No abnormal extra-axial
fluid collections. Basal cisterns are patent.

VASCULAR: Mild calcific atherosclerosis of the carotid siphons.

SKULL: No skull fracture. No significant scalp soft tissue swelling.

SINUSES/ORBITS: The mastoid air-cells and included paranasal sinuses
are well-aerated.The included ocular globes and orbital contents are
non-suspicious. Status post bilateral ocular lens implants.

OTHER: None.
IMPRESSION: 1. No acute intracranial process.
2. Old RIGHT basal ganglia lacunar infarcts; otherwise negative
noncontrast CT HEAD for age.

## 2022-06-05 ENCOUNTER — Emergency Department: Payer: No Typology Code available for payment source

## 2022-06-05 ENCOUNTER — Inpatient Hospital Stay
Admission: EM | Admit: 2022-06-05 | Discharge: 2022-06-13 | DRG: 854 | Disposition: A | Discharge status: Home / Self Care | Payer: No Typology Code available for payment source | Attending: Hospitalist | Admitting: Hospitalist

## 2022-06-05 DIAGNOSIS — Z006 Encounter for examination for normal comparison and control in clinical research program: Secondary | ICD-10-CM | POA: Diagnosis not present

## 2022-06-05 DIAGNOSIS — R791 Abnormal coagulation profile: Secondary | ICD-10-CM | POA: Diagnosis present

## 2022-06-05 DIAGNOSIS — W19XXXA Unspecified fall, initial encounter: Secondary | ICD-10-CM | POA: Diagnosis present

## 2022-06-05 DIAGNOSIS — W5501XA Bitten by cat, initial encounter: Secondary | ICD-10-CM | POA: Diagnosis not present

## 2022-06-05 DIAGNOSIS — A419 Sepsis, unspecified organism: Secondary | ICD-10-CM | POA: Diagnosis not present

## 2022-06-05 DIAGNOSIS — Z20822 Contact with and (suspected) exposure to covid-19: Secondary | ICD-10-CM | POA: Diagnosis present

## 2022-06-05 DIAGNOSIS — Z7982 Long term (current) use of aspirin: Secondary | ICD-10-CM | POA: Diagnosis not present

## 2022-06-05 DIAGNOSIS — R001 Bradycardia, unspecified: Secondary | ICD-10-CM | POA: Diagnosis not present

## 2022-06-05 DIAGNOSIS — N179 Acute kidney failure, unspecified: Secondary | ICD-10-CM | POA: Diagnosis present

## 2022-06-05 DIAGNOSIS — I5032 Chronic diastolic (congestive) heart failure: Secondary | ICD-10-CM | POA: Diagnosis present

## 2022-06-05 DIAGNOSIS — E872 Acidosis, unspecified: Secondary | ICD-10-CM | POA: Diagnosis present

## 2022-06-05 DIAGNOSIS — R338 Other retention of urine: Secondary | ICD-10-CM | POA: Diagnosis present

## 2022-06-05 DIAGNOSIS — R319 Hematuria, unspecified: Secondary | ICD-10-CM | POA: Diagnosis present

## 2022-06-05 DIAGNOSIS — I081 Rheumatic disorders of both mitral and tricuspid valves: Secondary | ICD-10-CM | POA: Diagnosis present

## 2022-06-05 DIAGNOSIS — Z79890 Hormone replacement therapy (postmenopausal): Secondary | ICD-10-CM | POA: Diagnosis not present

## 2022-06-05 DIAGNOSIS — R54 Age-related physical debility: Secondary | ICD-10-CM | POA: Diagnosis present

## 2022-06-05 DIAGNOSIS — A4159 Other Gram-negative sepsis: Secondary | ICD-10-CM | POA: Diagnosis present

## 2022-06-05 DIAGNOSIS — Z950 Presence of cardiac pacemaker: Secondary | ICD-10-CM | POA: Diagnosis not present

## 2022-06-05 DIAGNOSIS — I2510 Atherosclerotic heart disease of native coronary artery without angina pectoris: Secondary | ICD-10-CM | POA: Diagnosis present

## 2022-06-05 DIAGNOSIS — R6520 Severe sepsis without septic shock: Secondary | ICD-10-CM | POA: Diagnosis present

## 2022-06-05 DIAGNOSIS — I442 Atrioventricular block, complete: Secondary | ICD-10-CM | POA: Diagnosis present

## 2022-06-05 DIAGNOSIS — A280 Pasteurellosis: Secondary | ICD-10-CM | POA: Diagnosis not present

## 2022-06-05 DIAGNOSIS — N390 Urinary tract infection, site not specified: Secondary | ICD-10-CM | POA: Diagnosis not present

## 2022-06-05 DIAGNOSIS — Z79899 Other long term (current) drug therapy: Secondary | ICD-10-CM

## 2022-06-05 DIAGNOSIS — N401 Enlarged prostate with lower urinary tract symptoms: Secondary | ICD-10-CM | POA: Diagnosis present

## 2022-06-05 DIAGNOSIS — E039 Hypothyroidism, unspecified: Secondary | ICD-10-CM | POA: Diagnosis present

## 2022-06-05 DIAGNOSIS — Z7901 Long term (current) use of anticoagulants: Secondary | ICD-10-CM | POA: Diagnosis not present

## 2022-06-05 DIAGNOSIS — R339 Retention of urine, unspecified: Secondary | ICD-10-CM | POA: Diagnosis not present

## 2022-06-05 DIAGNOSIS — L03116 Cellulitis of left lower limb: Secondary | ICD-10-CM | POA: Diagnosis not present

## 2022-06-05 DIAGNOSIS — I110 Hypertensive heart disease with heart failure: Secondary | ICD-10-CM | POA: Diagnosis present

## 2022-06-05 DIAGNOSIS — R7881 Bacteremia: Secondary | ICD-10-CM | POA: Diagnosis not present

## 2022-06-05 DIAGNOSIS — I10 Essential (primary) hypertension: Secondary | ICD-10-CM | POA: Diagnosis present

## 2022-06-05 DIAGNOSIS — Z952 Presence of prosthetic heart valve: Secondary | ICD-10-CM

## 2022-06-05 DIAGNOSIS — M79662 Pain in left lower leg: Secondary | ICD-10-CM | POA: Diagnosis present

## 2022-06-05 LAB — URINALYSIS, COMPLETE (UACMP) WITH MICROSCOPIC
Bilirubin Urine: NEGATIVE
Glucose, UA: >=500 mg/dL — AB
Ketones, ur: NEGATIVE mg/dL
Nitrite: NEGATIVE
Protein, ur: 30 mg/dL — AB
RBC / HPF: >50 RBC/hpf — ABNORMAL HIGH (ref 0–5)
Specific Gravity, Urine: 1.024 (ref 1.005–1.030)
WBC, UA: >50 WBC/hpf — ABNORMAL HIGH (ref 0–5)
pH: 5 (ref 5.0–8.0)

## 2022-06-05 LAB — COMPREHENSIVE METABOLIC PANEL
ALT: 13 U/L (ref 0–44)
AST: 21 U/L (ref 15–41)
Albumin: 3.7 g/dL (ref 3.5–5.0)
Alkaline Phosphatase: 66 U/L (ref 38–126)
Anion gap: 9 (ref 5–15)
BUN: 32 mg/dL — ABNORMAL HIGH (ref 8–23)
CO2: 21 mmol/L — ABNORMAL LOW (ref 22–32)
Calcium: 8.7 mg/dL — ABNORMAL LOW (ref 8.9–10.3)
Chloride: 108 mmol/L (ref 98–111)
Creatinine, Ser: 1.5 mg/dL — ABNORMAL HIGH (ref 0.61–1.24)
GFR, Estimated: 43 mL/min — ABNORMAL LOW (ref >60)
Glucose, Bld: 138 mg/dL — ABNORMAL HIGH (ref 70–99)
Potassium: 3.9 mmol/L (ref 3.5–5.1)
Sodium: 138 mmol/L (ref 135–145)
Total Bilirubin: 1.1 mg/dL (ref 0.3–1.2)
Total Protein: 6.9 g/dL (ref 6.5–8.1)

## 2022-06-05 LAB — CBC WITH DIFFERENTIAL/PLATELET
Abs Immature Granulocytes: 0.04 10*3/uL (ref 0.00–0.07)
Basophils Absolute: 0 10*3/uL (ref 0.0–0.1)
Basophils Relative: 0 %
Eosinophils Absolute: 0 10*3/uL (ref 0.0–0.5)
Eosinophils Relative: 0 %
HCT: 31.9 % — ABNORMAL LOW (ref 39.0–52.0)
Hemoglobin: 10.4 g/dL — ABNORMAL LOW (ref 13.0–17.0)
Immature Granulocytes: 0 %
Lymphocytes Relative: 3 %
Lymphs Abs: 0.3 10*3/uL — ABNORMAL LOW (ref 0.7–4.0)
MCH: 29 pg (ref 26.0–34.0)
MCHC: 32.6 g/dL (ref 30.0–36.0)
MCV: 88.9 fL (ref 80.0–100.0)
Monocytes Absolute: 0.3 10*3/uL (ref 0.1–1.0)
Monocytes Relative: 3 %
Neutro Abs: 8.9 10*3/uL — ABNORMAL HIGH (ref 1.7–7.7)
Neutrophils Relative %: 94 %
Platelets: 170 10*3/uL (ref 150–400)
RBC: 3.59 MIL/uL — ABNORMAL LOW (ref 4.22–5.81)
RDW: 14.5 % (ref 11.5–15.5)
WBC: 9.5 10*3/uL (ref 4.0–10.5)
nRBC: 0 % (ref 0.0–0.2)

## 2022-06-05 LAB — PROTIME-INR
INR: 3.8 — ABNORMAL HIGH (ref 0.8–1.2)
Prothrombin Time: 37 seconds — ABNORMAL HIGH (ref 11.4–15.2)

## 2022-06-05 LAB — RESP PANEL BY RT-PCR (FLU A&B, COVID) ARPGX2
Influenza A by PCR: NEGATIVE
Influenza B by PCR: NEGATIVE
SARS Coronavirus 2 by RT PCR: NEGATIVE

## 2022-06-05 LAB — TROPONIN I (HIGH SENSITIVITY)
Troponin I (High Sensitivity): 100 ng/L (ref <18)
Troponin I (High Sensitivity): 222 ng/L (ref <18)
Troponin I (High Sensitivity): 240 ng/L (ref <18)

## 2022-06-05 LAB — CK: Total CK: 1513 U/L — ABNORMAL HIGH (ref 49–397)

## 2022-06-05 LAB — LACTIC ACID, PLASMA
Lactic Acid, Venous: 2.1 mmol/L (ref 0.5–1.9)
Lactic Acid, Venous: 1.1 mmol/L (ref 0.5–1.9)

## 2022-06-05 MED ORDER — ASCORBIC ACID 500 MG PO TABS
500.0000 mg | ORAL_TABLET | Freq: Two times a day (BID) | ORAL | Status: DC
  Administered 2022-06-05 – 2022-06-13 (×15): 500 mg via ORAL
  Filled 2022-06-05 (×15): qty 1

## 2022-06-05 MED ORDER — LEVOTHYROXINE SODIUM 100 MCG PO TABS
100.0000 ug | ORAL_TABLET | Freq: Every day | ORAL | Status: DC
  Administered 2022-06-06 – 2022-06-13 (×6): 100 ug via ORAL
  Filled 2022-06-05: qty 1
  Filled 2022-06-05: qty 2
  Filled 2022-06-05 (×4): qty 1

## 2022-06-05 MED ORDER — ONDANSETRON HCL 4 MG/2ML IJ SOLN: 4.0000 mg | Freq: Four times a day (QID) | INTRAMUSCULAR | Status: DC | PRN

## 2022-06-05 MED ORDER — ATROPINE SULFATE 1 MG/10ML IJ SOSY
1.0000 mg | PREFILLED_SYRINGE | INTRAMUSCULAR | Status: DC | PRN
Start: 2022-06-05 — End: 2022-06-13
  Filled 2022-06-05 (×2): qty 10

## 2022-06-05 MED ORDER — FINASTERIDE 5 MG PO TABS
5.0000 mg | ORAL_TABLET | Freq: Every day | ORAL | Status: DC
  Administered 2022-06-06 – 2022-06-13 (×8): 5 mg via ORAL
  Filled 2022-06-05 (×8): qty 1

## 2022-06-05 MED ORDER — IOHEXOL 300 MG/ML  SOLN
80.0000 mL | Freq: Once | INTRAMUSCULAR | Status: AC | PRN
  Administered 2022-06-05: 80 mL via INTRAVENOUS

## 2022-06-05 MED ORDER — POLYETHYLENE GLYCOL 3350 17 G PO PACK
17.0000 g | PACK | Freq: Every day | ORAL | Status: DC
  Administered 2022-06-06 – 2022-06-13 (×7): 17 g via ORAL
  Filled 2022-06-05 (×7): qty 1

## 2022-06-05 MED ORDER — SODIUM CHLORIDE 0.9 % IV BOLUS
500.0000 mL | Freq: Once | INTRAVENOUS | Status: AC
  Administered 2022-06-05: 500 mL via INTRAVENOUS

## 2022-06-05 MED ORDER — WARFARIN SODIUM 1 MG PO TABS
1.0000 mg | ORAL_TABLET | Freq: Once | ORAL | Status: AC
  Administered 2022-06-05: 1 mg via ORAL
  Filled 2022-06-05: qty 1

## 2022-06-05 MED ORDER — ACETAMINOPHEN 500 MG PO TABS
1000.0000 mg | ORAL_TABLET | Freq: Four times a day (QID) | ORAL | Status: DC | PRN
  Administered 2022-06-06 (×2): 1000 mg via ORAL
  Filled 2022-06-05 (×2): qty 2

## 2022-06-05 MED ORDER — WARFARIN - PHARMACIST DOSING INPATIENT
Freq: Every day | Status: DC
  Filled 2022-06-05: qty 1

## 2022-06-05 MED ORDER — SODIUM CHLORIDE 0.9 % IV SOLN
500.0000 mg | INTRAVENOUS | Status: DC
  Administered 2022-06-05: 500 mg via INTRAVENOUS
  Filled 2022-06-05: qty 5

## 2022-06-05 MED ORDER — LACTATED RINGERS IV SOLN
INTRAVENOUS | Status: DC
Start: 2022-06-05 — End: 2022-06-05

## 2022-06-05 MED ORDER — SODIUM CHLORIDE 0.9 % IV SOLN: 2.0000 g | Freq: Once | INTRAVENOUS | Status: DC

## 2022-06-05 MED ORDER — SODIUM CHLORIDE 0.9 % IV SOLN
2.0000 g | Freq: Two times a day (BID) | INTRAVENOUS | Status: AC
  Administered 2022-06-05 – 2022-06-06 (×3): 2 g via INTRAVENOUS
  Filled 2022-06-05 (×2): qty 12.5

## 2022-06-05 MED ORDER — ROSUVASTATIN CALCIUM 10 MG PO TABS
40.0000 mg | ORAL_TABLET | Freq: Every day | ORAL | Status: DC
  Administered 2022-06-05 – 2022-06-12 (×8): 40 mg via ORAL
  Filled 2022-06-05 (×4): qty 4
  Filled 2022-06-05: qty 2
  Filled 2022-06-05 (×3): qty 4

## 2022-06-05 MED ORDER — VANCOMYCIN HCL 1750 MG/350ML IV SOLN
1750.0000 mg | Freq: Once | INTRAVENOUS | Status: AC
  Administered 2022-06-05: 1750 mg via INTRAVENOUS
  Filled 2022-06-05: qty 350

## 2022-06-05 MED ORDER — SODIUM CHLORIDE 0.9 % IV SOLN
2.0000 g | INTRAVENOUS | Status: DC
  Administered 2022-06-05: 2 g via INTRAVENOUS
  Filled 2022-06-05: qty 20

## 2022-06-05 MED ORDER — ONDANSETRON HCL 4 MG PO TABS: 4.0000 mg | ORAL_TABLET | Freq: Four times a day (QID) | ORAL | Status: DC | PRN

## 2022-06-05 MED ORDER — LACTATED RINGERS IV SOLN: INTRAVENOUS | Status: AC

## 2022-06-05 MED ORDER — FERROUS SULFATE 325 (65 FE) MG PO TABS
324.0000 mg | ORAL_TABLET | Freq: Every day | ORAL | Status: DC
  Administered 2022-06-06: 324 mg via ORAL
  Administered 2022-06-07 – 2022-06-10 (×4): 325 mg via ORAL
  Administered 2022-06-12 – 2022-06-13 (×2): 324 mg via ORAL
  Filled 2022-06-05 (×7): qty 1

## 2022-06-05 MED ORDER — ASPIRIN 81 MG PO TBEC
81.0000 mg | DELAYED_RELEASE_TABLET | Freq: Every day | ORAL | Status: DC
  Administered 2022-06-05 – 2022-06-13 (×9): 81 mg via ORAL
  Filled 2022-06-05 (×9): qty 1

## 2022-06-05 MED ORDER — TAMSULOSIN HCL 0.4 MG PO CAPS
0.4000 mg | ORAL_CAPSULE | Freq: Every day | ORAL | Status: DC
  Administered 2022-06-05 – 2022-06-13 (×9): 0.4 mg via ORAL
  Filled 2022-06-05 (×8): qty 1

## 2022-06-05 MED ORDER — ATROPINE SULFATE 1 MG/10ML IJ SOSY
1.0000 mg | PREFILLED_SYRINGE | Freq: Once | INTRAMUSCULAR | Status: DC
Start: 2022-06-05 — End: 2022-06-05
  Administered 2022-06-05: 1 mg via INTRAVENOUS

## 2022-06-05 NOTE — Assessment & Plan Note (Signed)
Patient has a known history of BPH and was noted to have urinary retention upon arrival to the ER.  History of self-catheterization. Imaging showed markedly distended urinary bladder and a Foley catheter was placed in the ER -Continue Flomax and finasteride

## 2022-06-05 NOTE — Progress Notes (Signed)
CODE SEPSIS - PHARMACY COMMUNICATION  **Broad Spectrum Antibiotics should be administered within 1 hour of Sepsis diagnosis**  Time Code Sepsis Called/Page Received: 1224  Antibiotics Ordered: ceftriaxone 2 grams every 24 hours, azithromycin 500 mg every 24 hours, and vancomycin 1750 mg x 1  Time of 1st antibiotic administration: 1318  Additional action taken by pharmacy: messaged  If necessary, Name of Provider/Nurse Contacted: RN    Jaynie Bream ,PharmD Clinical Pharmacist  06/05/2022  12:18 PM

## 2022-06-05 NOTE — ED Notes (Signed)
Patient resting in bed with eyes closed. Opens eyes to verbal stimuli. Oriented to name, place, time and situation. NSR on monitor, repeat EKG performed. Denies chest pain or shortness of breath. Respirations even, unlabored on 2L per [place]. Reports back pain. Repositioned in stretcher for comfort. Assisted to upright position in stretcher and is now alert and eating dinner. No distress noted at this time. Foley patent and draining amber malodorous urine.

## 2022-06-05 NOTE — Progress Notes (Signed)
Elink following code sepsis °

## 2022-06-05 NOTE — Progress Notes (Signed)
PHARMACY -  BRIEF ANTIBIOTIC NOTE   Pharmacy has received consult(s) for vancomycin from an ED provider.  The patient's profile has been reviewed for ht/wt/allergies/indication/available labs.    One time order(s) placed for  vancomycin 1750 mg x 1  Further antibiotics/pharmacy consults should be ordered by admitting physician if indicated.                       Thank you, Jaynie Bream 06/05/2022  12:18 PM

## 2022-06-05 NOTE — ED Notes (Signed)
Patient currently too drowsy to take PO medications.

## 2022-06-05 NOTE — ED Triage Notes (Signed)
Nauseated and vomiting per ems

## 2022-06-05 NOTE — ED Notes (Signed)
Patient with bradycardia, appears to be in complete heart block with rate of 30's. Lethargic but responds to verbal stimuli and states "I feel fine." Patient unable to stay awake. External pacing pads in place, EKG performed and K.Foust notified via phone.

## 2022-06-05 NOTE — Assessment & Plan Note (Signed)
Stable Continue Synthroid 

## 2022-06-05 NOTE — Progress Notes (Signed)
Notified bedside nurse of need to draw repeat lactic acid. 

## 2022-06-05 NOTE — ED Provider Notes (Addendum)
[facility] Provider Note    Event Date/Time   First MD Initiated Contact with Patient 06/05/22 1044     (approximate)   History   Weakness (General weakness, fever, 101.5, hypotensive per ems 97/50, [facility] lives at home with grandaughter, no co pain )   HPI  Dean Cruz is a 88 y.o. male elderly gentleman who presents to the ER for evaluation of of generalized weakness.  Symptoms been ongoing for several days.  Patient reportedly had outpatient labs at the [facility] yesterday but he did not tell anyone that has been feeling sick.  Did have an episode of nausea and vomiting today denies any abdominal pain no cough was found to be with low O2 sats 90% via EMS placed on 2 L nasal cannula.  Family states that he has been more sluggish and had general malaise but has not complained about any specific discomfort over the past few days.     Physical Exam   Triage Vital Signs: ED Triage Vitals  Enc Vitals Group     BP 06/05/22 1045 (!) 108/51     Pulse Rate 06/05/22 1045 85     Resp --      Temp 06/05/22 1045 (!) 101.6 F (38.7 C)     Temp Source 06/05/22 1045 Axillary     SpO2 06/05/22 1045 98 %     Weight --      Height --      Head Circumference --      Peak Flow --      Pain Score 06/05/22 1047 0     Pain Loc --      Pain Edu? --      Excl. in GC? --     Most recent vital signs: Vitals:   06/05/22 1139 06/05/22 1352  BP:  (!) 110/50  Pulse: (!) 46 60  Resp: (!) 21 19  Temp:    SpO2: 95% 98%     Constitutional: Alert  Eyes: Conjunctivae are normal.  Head: Atraumatic. Nose: No congestion/rhinnorhea. Mouth/Throat: Mucous membranes are moist.   Neck: Painless ROM.  Cardiovascular:   Good peripheral circulation. Respiratory: Normal respiratory effort.  No retractions.  Diminished bibasilar breath sounds Gastrointestinal: Soft and nontender.  Musculoskeletal:  no deformity Neurologic:  MAE  spontaneously. No gross focal neurologic deficits are appreciated.  Skin:  Skin is warm, dry and intact. There is rash and some warmth to lle Psychiatric: Mood and affect are normal. Speech and behavior are normal.    ED Results / Procedures / Treatments   Labs (all labs ordered are listed, but only abnormal results are displayed) Labs Reviewed  LACTIC ACID, PLASMA - Abnormal; Notable for the following components:      Result Value   Lactic Acid, Venous 2.1 (*)    All other components within normal limits  COMPREHENSIVE METABOLIC PANEL - Abnormal; Notable for the following components:   CO2 21 (*)    Glucose, Bld 138 (*)    BUN 32 (*)    Creatinine, Ser 1.50 (*)    Calcium 8.7 (*)    GFR, Estimated 43 (*)    All other components within normal limits  CBC WITH DIFFERENTIAL/PLATELET - Abnormal; Notable for the following components:   RBC 3.59 (*)    Hemoglobin 10.4 (*)    HCT 31.9 (*)    Neutro Abs 8.9 (*)    Lymphs Abs 0.3 (*)  All other components within normal limits  RESP PANEL BY RT-PCR (FLU A&B, COVID) [id number]  CULTURE, BLOOD (ROUTINE X 2)  CULTURE, BLOOD (ROUTINE X 2)  URINE CULTURE  LACTIC ACID, PLASMA  URINALYSIS, COMPLETE (UACMP) WITH MICROSCOPIC  TROPONIN I (HIGH SENSITIVITY)  TROPONIN I (HIGH SENSITIVITY)     EKG  ED ECG REPORT I, Willy Eddy, the attending physician, personally viewed and interpreted this ECG.   Date: 06/05/2022  EKG Time: 10:48  Rate: 50  Rhythm: complete av block  Axis: normal  Intervals: normal qt  ST&T Change: no stemi, no depressions    RADIOLOGY Please see ED Course for my review and interpretation.  I personally reviewed all radiographic images ordered to evaluate for the above acute complaints and reviewed radiology reports and findings.  These findings were personally discussed with the patient.  Please see medical record for radiology report.    PROCEDURES:  Critical Care performed: Yes, see critical care  procedure note(s)  .Critical Care  Performed by: Willy Eddy, MD Authorized by: Willy Eddy, MD   Critical care provider statement:    Critical care time (minutes):  45   Critical care was necessary to treat or prevent imminent or life-threatening deterioration of the following conditions:  Sepsis   Critical care was time spent personally by me on the following activities:  Ordering and performing treatments and interventions, ordering and review of laboratory studies, ordering and review of radiographic studies, pulse oximetry, re-evaluation of patient's condition, review of old charts, obtaining history from patient or surrogate, examination of patient, evaluation of patient's response to treatment, discussions with primary provider, discussions with consultants and development of treatment plan with patient or surrogate    MEDICATIONS ORDERED IN ED: Medications  lactated ringers infusion ( Intravenous New Bag/Given 06/05/22 1316)  cefTRIAXone (ROCEPHIN) 2 g in sodium chloride 0.9 % 100 mL IVPB (0 g Intravenous Stopped 06/05/22 1352)  azithromycin (ZITHROMAX) 500 mg in sodium chloride 0.9 % 250 mL IVPB (500 mg Intravenous New Bag/Given 06/05/22 1326)  vancomycin (VANCOREADY) IVPB 1750 mg/350 mL (1,750 mg Intravenous New Bag/Given 06/05/22 1413)  sodium chloride 0.9 % bolus 500 mL (500 mLs Intravenous New Bag/Given 06/05/22 1317)  iohexol (OMNIPAQUE) 300 MG/ML solution 80 mL (80 mLs Intravenous Contrast Given 06/05/22 1350)  sodium chloride 0.9 % bolus 500 mL (500 mLs Intravenous New Bag/Given 06/05/22 1412)     IMPRESSION / MDM / ASSESSMENT AND PLAN / ED COURSE  I reviewed the triage vital signs and the nursing notes.                              Differential diagnosis includes, but is not limited to, Dehydration, sepsis, pna, uti, hypoglycemia, cva, drug effect, withdrawal, encephalitis  Patient presented to the ER for evaluation of symptoms as described above.  This  presenting complaint could reflect a potentially life-threatening illness therefore the patient will be placed on continuous pulse oximetry and telemetry for monitoring.  Laboratory evaluation will be sent to evaluate for the above complaints.  CT imaging ordered for the above differential.  Will order blood work to evaluate for suspected sepsis we will order broad-spectrum antibiotics.    Clinical Course as of 06/05/22 1448  Tue Jun 05, 2022  1130 Chest x-ray on my review and interpretation does not show evidence of consolidation or pneumothorax. [PR]  1209 Patient found to be bradycardic in the mid to high 40s and on EKG that  shows that he is in complete heart block.  Pacer pads placed on patient.  He denies any chest pain or pressure.  Has history of aortic valve replacement on Coumadin denies any history of irregular heartbeat [PR]  1445 CT imaging with evidence of urinary retention.  Will place Foley catheter.  Suspect urinary source is COVID is negative no other source of infection at this time.  Does have some cellulitis of the lower extremity as another possible source of infection.  Patient will require hospitalization.  I have consult hospitalist for admission. [PR]    Clinical Course User Index [PR] Willy Eddy, MD    FINAL CLINICAL IMPRESSION(S) / ED DIAGNOSES   Final diagnoses:  Sepsis with acute hypoxic respiratory failure, due to unspecified organism, unspecified whether septic shock present (HCC)  Complete heart block (HCC)     Rx / DC Orders   ED Discharge Orders     None        Note:  This document was prepared using Dragon voice recognition software and may include unintentional dictation errors.      Willy Eddy, MD 06/05/22 [phone]

## 2022-06-05 NOTE — ED Notes (Signed)
Dean Cruz at bedside at this time. ?

## 2022-06-05 NOTE — ED Notes (Signed)
Patient more alert at this time. Reports chills, rigors noted. States he feels tired. Resp even, unlabored on 2L per [place]. HR now at 70's

## 2022-06-05 NOTE — Consult Note (Signed)
ANTICOAGULATION CONSULT NOTE - Initial Consult  Pharmacy Consult for Warfarin Indication:  Mechanical Mitral Valve   No Known Allergies  Patient Measurements: Height: 5\' 8"  (172.7 cm) Weight: 83 kg (182 lb 15.7 oz) IBW/kg (Calculated) : 68.4   Vital Signs: Temp: 98.7 F (37.1 C) (07/25 1908) Temp Source: Oral (07/25 1908) BP: 118/51 (07/25 1908) Pulse Rate: 60 (07/25 1908)  Labs: Recent Labs    06/05/22 1051 06/05/22 2027  HGB 10.4*  --   HCT 31.9*  --   PLT 170  --   LABPROT  --  37.0*  INR  --  3.8*  CREATININE 1.50*  --   CKTOTAL  --  1,513*  TROPONINIHS 100*  --     Estimated Creatinine Clearance: 32.3 mL/min (A) (by C-G formula based on SCr of 1.5 mg/dL (H)).   Medical History: Past Medical History:  Diagnosis Date   CHF (congestive heart failure) (HCC)    Hypertension     Medications:  (Not in a hospital admission)  Scheduled:   vitamin C  500 mg Oral BID   aspirin EC  81 mg Oral Daily   [START ON 06/06/2022] ferrous sulfate  324 mg Oral Q breakfast   [START ON 06/06/2022] finasteride  5 mg Oral Daily   [START ON 06/06/2022] levothyroxine  100 mcg Oral QAC breakfast   [START ON 06/06/2022] polyethylene glycol  17 g Oral Daily   rosuvastatin  40 mg Oral QHS   tamsulosin  0.4 mg Oral Daily   warfarin  2.5 mg Oral Daily   Infusions:   ceFEPime (MAXIPIME) IV Stopped (06/05/22 1652)   lactated ringers 125 mL/hr at 06/05/22 1627   PRN: acetaminophen, ondansetron **OR** ondansetron (ZOFRAN) IV  Assessment: Pharmacy consulted to restart warfarin for mechanical Mitral valve. Pt takes warfarin 2.5 mg daily per med rec. No major DDI PTA, but pt is started on cefepime.   Date INR Warfarin Dose  7/25 3.8 1 mg         Goal of Therapy:  INR 2.5 - 3.5 Monitor platelets by anticoagulation protocol: Yes   Plan:  INR is slightly supratherapeutic. Will give warfarin 1 mg x 1 tonight (decreased from home dose), to ensure INR does not drop below 2.5.  Hopefully INR till trend down in the AM. Daily INR. CBC at least every 3 days.   8/25, PharmD, BCPS 06/05/2022,9:03 PM

## 2022-06-05 NOTE — ED Triage Notes (Signed)
General weakness, fever, 101.5, hypotensive per ems 97/50, [facility] , lives at home with grandaughter, no co pain

## 2022-06-05 NOTE — Progress Notes (Signed)
Notified bedside nurse of need to administer antibiotics.  

## 2022-06-05 NOTE — H&P (Signed)
History and Physical    Patient: Dean Cruz ZJI:[id number] DOB: 05-Dec-1926 DOA: 06/05/2022 DOS: the patient was seen and examined on 06/05/2022 PCP: [facility] [place]  Patient coming from: Home  Chief Complaint:  Chief Complaint  Patient presents with   Weakness    General weakness, fever, 101.5, hypotensive per ems 97/50, [facility] , lives at home with grandaughter, no co pain    HPI: Dean Cruz is a 88 y.o. male with medical history significant for chronic diastolic dysfunction CHF, hypertension, BPH with history of urinary retention and self-catheterization twice daily 6 at home who was brought into the ER by EMS for evaluation of generalized weakness. Patient states that he woke up on the morning of his admission and was trying to put his socks on when he slid out off his chair landing on the floor.  He was too weak to get up and so he sat on the floor for about an hour until his granddaughter came home and found him.  She called EMS and he was found to be febrile with a Tmax of 101.5 and hypotensive with a blood pressure of 97/50. He states that he was nauseous in route to the hospital and had an episode of emesis in the ambulance.  His oral intake has been poor over the last several days and he complains of feeling very weak but denies any dizziness or lightheadedness. He denies having any chest pain, no shortness of breath, no headache, no changes in his bowel habits, no leg swelling, no blurred vision or focal deficit. He had a recent cat bite involving his left leg and has some redness over the left anterior leg.   Per EMS patient had room air pulse oximetry of 90% and was placed on 2 L of oxygen  Review of Systems: As mentioned in the history of present illness. All other systems reviewed and are negative. Past Medical History:  Diagnosis Date   CHF (congestive heart failure) ([place])    Hypertension    Past Surgical History:   Procedure Laterality Date   CARDIAC SURGERY     VALVE REPLACEMENT     Social History:  reports that he has never smoked. He has never used smokeless tobacco. He reports that he does not drink alcohol and does not use drugs.  No Known Allergies  History reviewed. No pertinent family history.  Prior to Admission medications   Medication Sig Start Date End Date Taking? Authorizing Provider  acetaminophen (TYLENOL) 500 MG tablet Take 2 tablets by mouth every 6 (six) hours as needed. 12/17/19  Yes [provider]  amLODipine (NORVASC) 10 MG tablet Take 5 mg by mouth daily.   Yes [provider]  Aspirin 81 MG CAPS Take 1 tablet by mouth daily. 12/17/19  Yes [provider]  ferrous sulfate 325 (65 FE) MG EC tablet Take 324 mg by mouth daily with breakfast.   Yes [provider]  finasteride (PROSCAR) 5 MG tablet Take 5 mg by mouth daily.   Yes [provider]  levothyroxine (SYNTHROID, LEVOTHROID) 112 MCG tablet Take 125 mcg by mouth daily before breakfast.   Yes [provider]  lisinopril (PRINIVIL,ZESTRIL) 40 MG tablet Take 40 mg by mouth daily.   Yes [provider]  polyethylene glycol (MIRALAX / GLYCOLAX) 17 g packet Take 17 g by mouth daily. 12/17/19  Yes [provider]  rosuvastatin (CRESTOR) 40 MG tablet Take 40 mg by mouth  at bedtime.   Yes [provider]  tamsulosin (FLOMAX) 0.4 MG CAPS capsule Take 1 capsule by mouth daily. 12/17/19  Yes [provider]  vitamin C (ASCORBIC ACID) 500 MG tablet Take 500 mg by mouth 2 (two) times daily. Noon and bedtime   Yes [provider]  warfarin (COUMADIN) 2.5 MG tablet Take 2.5 mg by mouth daily. Take one tablet by mouth every day, except take 1.25 Tuesday.   Yes [provider]  metoprolol succinate (TOPROL-XL) 25 MG 24 hr tablet Take 25 mg by mouth daily. Patient not taking: Reported on 06/05/2022    [provider]    Physical  Exam: Vitals:   06/05/22 1139 06/05/22 1219 06/05/22 1352 06/05/22 1500  BP:   (!) 110/50 118/78  Pulse: (!) 46  60 78  Resp: (!) 21  19 (!) 21  Temp:    98.3 F (36.8 C)  TempSrc:    Oral  SpO2: 95%  98% 97%  Weight:  83 kg    Height:  _0  (1.727 m)     Physical Exam Vitals and nursing note reviewed.  Constitutional:      Comments: Acutely ill appearing.  Weak  HENT:     Head: Normocephalic and atraumatic.     Nose: Nose normal.     Mouth/Throat:     Mouth: Mucous membranes are dry.  Eyes:     Comments: Pale conjunctive  Cardiovascular:     Rate and Rhythm: Normal rate and regular rhythm.  Pulmonary:     Effort: Pulmonary effort is normal.     Breath sounds: Normal breath sounds.  Abdominal:     General: Abdomen is flat. Bowel sounds are normal.     Palpations: Abdomen is soft.  Genitourinary:    Comments: Foley catheter placed in the ER Musculoskeletal:        General: Normal range of motion.     Cervical back: Normal range of motion and neck supple.  Skin:    Findings: Erythema present.     Comments: Redness over the left anterior leg.  Differential warmth  Neurological:     Mental Status: He is alert.     Motor: Weakness present.  Psychiatric:        Mood and Affect: Mood normal.        Behavior: Behavior normal.     Data Reviewed: Relevant notes from primary care and specialist visits, past discharge summaries as available in EHR, including Care Everywhere. Prior diagnostic testing as pertinent to current admission diagnoses Updated medications and problem lists for reconciliation ED course, including vitals, labs, imaging, treatment and response to treatment Triage notes, nursing and pharmacy notes and ED provider's notes Notable results as noted in HPI Labs reviewed.  Sodium 138, potassium 3.9, chloride 108, bicarb 21, glucose 138, BUN 32, creatinine 1.50, calcium 8.7, total protein 6.9, albumin 3.7, AST 21, ALT 13, alk phos 66, total bilirubin 1.1,  white count 9.5 with a left shift, hemoglobin 10.4, hematocrit 31.9, platelet count 170, lactic acid 2.1 >> 1.1 Urine analysis shows pyuria Chest x-ray reviewed by me shows low lung volumes with vascular crowding and bibasilar atelectasis CT scan of the head without contrast shows no acute findings CT scan of abdomen and pelvis shows markedly distended urinary bladder extending up into the abdomen.Correlate with signs of bladder outlet obstruction or urinary retention. Urinary bladder extends well into the low abdomen from the pelvis approximately 16 cm greatest axial dimension. Would also  correlate with urinalysis in the context of potential bladder outlet obstruction.Posterior cortical lesion on the LEFT suspicious for small solid renal neoplasm in the lower pole of the LEFT kidney. 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 Consider comparison with prior imaging or follow-up with MRI in 2 years as warranted. Cholelithiasis without evidence of acute cholecystitis.Mild infrarenal abdominal aortic aneurysm at 3.4 x 3.2 cm. 3.4 cm infrarenal abdominal aortic aneurysm. Recommend follow-up every 3 years. Colonic diverticulosis without evidence of acute diverticulitis. Aortic atherosclerosis. Twelve-lead EKG reviewed by me shows sinus/ectopic atrial rhythm with right bundle branch block and left anterior fascicular block.  LVH There are no new results to review at this time.  Assessment and Plan: * Sepsis secondary to UTI [facility]) As evidenced by fever with a Tmax of 101.6, relative hypotension with systolic blood pressure in the 90s, lactic acidosis and pyuria. Patient has a history of BPH with urinary retention and does self-catheterization twice a day. We will place patient empirically on cefepime  while awaiting results of urine culture Continue judicious IV fluid resuscitation and monitor patient's respiratory status Follow-up results of blood and urine culture   Hypertension Patient has relative hypotension due to sepsis Hold amlodipine, lisinopril and metoprolol  Acute urinary retention Patient has a known history of BPH and was noted to have urinary retention upon arrival to the ER Imaging showed markedly distended urinary bladder and a Foley catheter was placed in the ER Continue Flomax and finasteride  Hypothyroidism Stable Continue Synthroid  H/O heart valve replacement with mechanical valve Patient has a history of mechanical heart valve (Aortic) and is on Coumadin Check daily PT/INR      Advance Care Planning:   Code Status: Full Code   Consults: None  Family Communication: Greater than 50% of time was spent discussing patient's condition and plan of care with him and his son at the bedside.  All questions and concerns have been addressed.  They verbalized understanding and agree with the plan.  CODE STATUS was discussed and he wishes to be full code.  He lists his children as his healthcare power of attorney.  Severity of Illness: The appropriate patient status for this patient is INPATIENT. Inpatient status is judged to be reasonable and necessary in order to provide the required intensity of service to ensure the patient's safety. The patient's presenting symptoms, physical exam findings, and initial radiographic and laboratory data in the context of their chronic comorbidities is felt to place them at high risk for further clinical deterioration. Furthermore, it is not anticipated that the patient will be medically stable for discharge from the hospital within 2 midnights of admission.   * I certify that at the point of admission it is my clinical judgment that the patient will require inpatient hospital care spanning beyond 2 midnights from the point of  admission due to high intensity of service, high risk for further deterioration and high frequency of surveillance required.*  Author: Collier Bullock, MD 06/05/2022 4:13 PM  For on call review [email or web address].

## 2022-06-05 NOTE — ED Notes (Signed)
Pt placed on zoll pads , pt in complete heart block

## 2022-06-05 NOTE — ED Notes (Signed)
Katy Foust mesaged via secure chat to notify of troponin of 100 from lab collected at 1051.

## 2022-06-05 NOTE — Assessment & Plan Note (Signed)
Patient has relative hypotension due to sepsis Hold amlodipine, lisinopril and metoprolol

## 2022-06-05 NOTE — Progress Notes (Addendum)
       CROSS COVER NOTE  NAME: Dean Cruz MRN: [id number] DOB : 11/10/1927    Date of Service   06/05/22  HPI/Events of Note   HR 32 BP 92/40 and lethargic. He responds appropriately to verbal stimuli and is oriented but quickly falls back asleep.   Interventions   Plan:  Symptomatic Bradycardia Atropine x1 Keep pads on       This document was prepared using Dragon voice recognition software and may include unintentional dictation errors.  Bishop Limbo DNP, MHA, FNP-BC Nurse Practitioner [facility] Pager [phone]

## 2022-06-05 NOTE — Assessment & Plan Note (Signed)
As evidenced by fever with a Tmax of 101.6, relative hypotension with systolic blood pressure in the 90s, lactic acidosis and pyuria. Patient has a history of BPH with urinary retention and does self-catheterization twice a day. We will place patient empirically on cefepime while awaiting results of urine culture Continue judicious IV fluid resuscitation and monitor patient's respiratory status Follow-up results of blood and urine culture

## 2022-06-05 NOTE — Progress Notes (Signed)
Notified provider and bedside nurse of need to order and administer fluid bolus. Pt needs 2490 cc

## 2022-06-05 NOTE — Progress Notes (Signed)
Pharmacy Antibiotic Note  Dean Cruz is a 88 y.o. male admitted on 06/05/2022 with sepsis 2/2 UTI. Past medical history includes dCHF, HTN, BPH with history of urinary retention and self-catheterization at home. Pharmacy has been consulted for cefepime dosing.  Renal function borderline within CrCl 10-30 mL/min.  Plan: Start cefepime 2 grams every 12 hours  Monitor renal function for adjustments, C&S and length of therapy.  Height: 5\' 8"  (172.7 cm) Weight: 83 kg (182 lb 15.7 oz) IBW/kg (Calculated) : 68.4  Temp (24hrs), Avg:100 F (37.8 C), Min:98.3 F (36.8 C), Max:101.6 F (38.7 C)  Recent Labs  Lab 06/05/22 1051 06/05/22 1500  WBC 9.5  --   CREATININE 1.50*  --   LATICACIDVEN 2.1* 1.1    Estimated Creatinine Clearance: 32.3 mL/min (A) (by C-G formula based on SCr of 1.5 mg/dL (H)).    No Known Allergies  Antimicrobials this admission: ceftriaxone 7/25 >> 7/25 azithromycin 7/25 >> 7/25 cefepime 7/25 >>  Dose adjustments this admission: N/a  Microbiology results: 7/25 BCx: in process 7/25 UCx: in process   Thank you for allowing pharmacy to be a part of this patient's care.  8/25 06/05/2022 3:50 PM

## 2022-06-05 NOTE — Assessment & Plan Note (Signed)
Patient has a history of mechanical heart valve (Aortic) and is on Coumadin. INR supratherapeutic. Check daily PT/INR and management of Coumadin per pharmacy. Received 1 dose of IV vitamin K to reverse for cardiac procedure

## 2022-06-06 ENCOUNTER — Inpatient Hospital Stay
Admit: 2022-06-06 | Discharge: 2022-06-06 | Disposition: A | Discharge status: Home / Self Care | Payer: No Typology Code available for payment source | Attending: Cardiovascular Disease | Admitting: Cardiovascular Disease

## 2022-06-06 ENCOUNTER — Encounter
Admission: EM | Disposition: A | Discharge status: Home / Self Care | Payer: Self-pay | Source: Home / Self Care | Attending: Internal Medicine

## 2022-06-06 ENCOUNTER — Other Ambulatory Visit: Payer: Self-pay

## 2022-06-06 ENCOUNTER — Inpatient Hospital Stay: Payer: No Typology Code available for payment source

## 2022-06-06 DIAGNOSIS — Z952 Presence of prosthetic heart valve: Secondary | ICD-10-CM | POA: Diagnosis not present

## 2022-06-06 DIAGNOSIS — I442 Atrioventricular block, complete: Secondary | ICD-10-CM | POA: Diagnosis present

## 2022-06-06 DIAGNOSIS — N390 Urinary tract infection, site not specified: Secondary | ICD-10-CM | POA: Diagnosis not present

## 2022-06-06 DIAGNOSIS — A419 Sepsis, unspecified organism: Secondary | ICD-10-CM | POA: Diagnosis not present

## 2022-06-06 DIAGNOSIS — R338 Other retention of urine: Secondary | ICD-10-CM

## 2022-06-06 HISTORY — PX: TEMPORARY PACEMAKER: CATH118268

## 2022-06-06 LAB — PROTIME-INR
INR: 3.8 — ABNORMAL HIGH (ref 0.8–1.2)
INR: 1.9 — ABNORMAL HIGH (ref 0.8–1.2)
Prothrombin Time: 37 seconds — ABNORMAL HIGH (ref 11.4–15.2)
Prothrombin Time: 21.3 seconds — ABNORMAL HIGH (ref 11.4–15.2)

## 2022-06-06 LAB — BLOOD CULTURE ID PANEL (REFLEXED) - BCID2

## 2022-06-06 LAB — ECHOCARDIOGRAM COMPLETE
AR max vel: 0.91 cm2
AV Area VTI: 1.12 cm2
AV Area mean vel: 0.98 cm2
AV Mean grad: 15 mmHg
AV Peak grad: 30 mmHg
Ao pk vel: 2.74 m/s
Area-P 1/2: 1.89 cm2
Height: 68 in
MV VTI: 1.59 cm2
S' Lateral: 3 cm
Weight: 2927.71 oz

## 2022-06-06 LAB — TROPONIN I (HIGH SENSITIVITY)
Troponin I (High Sensitivity): 266 ng/L (ref <18)
Troponin I (High Sensitivity): 297 ng/L (ref <18)

## 2022-06-06 LAB — CBC
HCT: 28.9 % — ABNORMAL LOW (ref 39.0–52.0)
Hemoglobin: 9.2 g/dL — ABNORMAL LOW (ref 13.0–17.0)
MCH: 29.2 pg (ref 26.0–34.0)
MCHC: 31.8 g/dL (ref 30.0–36.0)
MCV: 91.7 fL (ref 80.0–100.0)
Platelets: 146 10*3/uL — ABNORMAL LOW (ref 150–400)
RBC: 3.15 MIL/uL — ABNORMAL LOW (ref 4.22–5.81)
RDW: 14.7 % (ref 11.5–15.5)
WBC: 7.4 10*3/uL (ref 4.0–10.5)
nRBC: 0 % (ref 0.0–0.2)

## 2022-06-06 LAB — BASIC METABOLIC PANEL
Anion gap: 7 (ref 5–15)
BUN: 29 mg/dL — ABNORMAL HIGH (ref 8–23)
CO2: 23 mmol/L (ref 22–32)
Calcium: 8.2 mg/dL — ABNORMAL LOW (ref 8.9–10.3)
Chloride: 108 mmol/L (ref 98–111)
Creatinine, Ser: 1.54 mg/dL — ABNORMAL HIGH (ref 0.61–1.24)
GFR, Estimated: 42 mL/min — ABNORMAL LOW (ref >60)
Glucose, Bld: 98 mg/dL (ref 70–99)
Potassium: 3.4 mmol/L — ABNORMAL LOW (ref 3.5–5.1)
Sodium: 138 mmol/L (ref 135–145)

## 2022-06-06 LAB — MRSA NEXT GEN BY PCR, NASAL: MRSA by PCR Next Gen: NOT DETECTED

## 2022-06-06 SURGERY — TEMPORARY PACEMAKER
Anesthesia: Moderate Sedation | Laterality: Right

## 2022-06-06 MED ORDER — PIPERACILLIN-TAZOBACTAM 3.375 G IVPB
3.3750 g | Freq: Three times a day (TID) | INTRAVENOUS | Status: DC
  Administered 2022-06-07 (×2): 3.375 g via INTRAVENOUS
  Filled 2022-06-06 (×2): qty 50

## 2022-06-06 MED ORDER — IBUPROFEN 400 MG PO TABS
400.0000 mg | ORAL_TABLET | Freq: Once | ORAL | Status: AC
  Administered 2022-06-06: 400 mg via ORAL
  Filled 2022-06-06: qty 1

## 2022-06-06 MED ORDER — HEPARIN (PORCINE) IN NACL 1000-0.9 UT/500ML-% IV SOLN
INTRAVENOUS | Status: DC | PRN
  Administered 2022-06-06: 500 mL

## 2022-06-06 MED ORDER — WARFARIN SODIUM 5 MG PO TABS
5.0000 mg | ORAL_TABLET | Freq: Once | ORAL | Status: DC
  Filled 2022-06-06: qty 1

## 2022-06-06 MED ORDER — VITAMIN K1 10 MG/ML IJ SOLN
10.0000 mg | Freq: Once | INTRAVENOUS | Status: AC
  Administered 2022-06-06: 10 mg via INTRAVENOUS
  Filled 2022-06-06: qty 1

## 2022-06-06 MED ORDER — NITROGLYCERIN 0.4 MG SL SUBL
SUBLINGUAL_TABLET | SUBLINGUAL | Status: AC
  Filled 2022-06-06: qty 1

## 2022-06-06 MED ORDER — LIDOCAINE HCL 1 % IJ SOLN
INTRAMUSCULAR | Status: AC
  Filled 2022-06-06: qty 20

## 2022-06-06 MED ORDER — CHLORHEXIDINE GLUCONATE CLOTH 2 % EX PADS
6.0000 | MEDICATED_PAD | Freq: Every day | CUTANEOUS | Status: DC
Start: 2022-06-06 — End: 2022-06-13
  Administered 2022-06-06 – 2022-06-12 (×4): 6 via TOPICAL

## 2022-06-06 MED ORDER — NITROGLYCERIN 0.4 MG SL SUBL: 0.4000 mg | SUBLINGUAL_TABLET | SUBLINGUAL | Status: DC | PRN

## 2022-06-06 MED ORDER — SODIUM CHLORIDE 0.9 % IV SOLN: INTRAVENOUS | Status: DC

## 2022-06-06 MED ORDER — LIDOCAINE HCL (PF) 1 % IJ SOLN
INTRAMUSCULAR | Status: DC | PRN
  Administered 2022-06-06: 5 mL

## 2022-06-06 SURGICAL SUPPLY — 7 items
CABLE ADAPT PACING TEMP 12FT (ADAPTER) ×1 IMPLANT
DRAPE BRACHIAL (DRAPES) ×1 IMPLANT
NDL PERC 18GX7CM (NEEDLE) IMPLANT
NEEDLE PERC 18GX7CM (NEEDLE) ×2 IMPLANT
SHEATH AVANTI 6FR X 11CM (SHEATH) ×1 IMPLANT
SLEEVE REPOSITIONING LENGTH 30 (MISCELLANEOUS) ×1 IMPLANT
WIRE PACING TEMP ST TIP 5 (CATHETERS) ×1 IMPLANT

## 2022-06-06 NOTE — Assessment & Plan Note (Addendum)
Patient with symptomatic bradycardia and repeat EKG this morning with concern of complete heart block.  Cardiology was consulted and a temporary percutaneous pacemaker was placed. INR remained supra therapeutic-he was given 10 mg of vitamin K and will need a pacemaker placement once INR improves. -Continue to monitor -Repeat INR in the afternoon -Appreciate cardiology help

## 2022-06-06 NOTE — Progress Notes (Signed)
Pharmacy Antibiotic Note  Dean Cruz is a 88 y.o. male admitted on 06/05/2022 with gram neg bacteremia.  Pharmacy has been consulted for Zosyn dosing.  Plan: Zosyn 3.375g q8h (4 hr infusion) per indication & renal fxn  Pharmacy will continue to follow and will adjust abx dosing whenever warranted.  Height: 5\' 6"  (167.6 cm) Weight: 78.5 kg (173 lb 1 oz) IBW/kg (Calculated) : 63.8  Temp (24hrs), Avg:99.6 F (37.6 C), Min:97.9 F (36.6 C), Max:102.5 F (39.2 C)   Recent Labs  Lab 06/05/22 1051 06/05/22 1500 06/06/22 0412  WBC 9.5  --  7.4  CREATININE 1.50*  --  1.54*  LATICACIDVEN 2.1* 1.1  --     Estimated Creatinine Clearance: 29.5 mL/min (A) (by C-G formula based on SCr of 1.54 mg/dL (H)).    No Known Allergies  Antimicrobials this admission: 7/25 Cefepime >> 7/26 7/27 Zosyn >>   Microbiology results: 7/25 BCx: 4 of 4 bottles w/ Gram Neg Diplococci 7/5 UCx: Pending   Thank you for allowing pharmacy to be a part of this patient's care.  9/5, PharmD, [facility] 06/06/2022 10:12 PM

## 2022-06-06 NOTE — Consult Note (Signed)
Dean Cruz is a 88 y.o. male  [id number]  Primary Cardiologist: Adrian Blackwater Reason for Consultation: Complete heart block  HPI: Is a 88 year old white male with a past medical history of coronary artery disease congestive heart failure hypertension and mitral mechanical valve replacement, presented to the hospital with an episode where he fell down on Monday and confusion and lethargy and weakness.   Review of Systems: No chest pain orthopnea PND or leg swelling   Past Medical History:  Diagnosis Date   CHF (congestive heart failure) (HCC)    Hypertension     (Not in a hospital admission)     vitamin C  500 mg Oral BID   aspirin EC  81 mg Oral Daily   ferrous sulfate  324 mg Oral Q breakfast   finasteride  5 mg Oral Daily   levothyroxine  100 mcg Oral QAC breakfast   polyethylene glycol  17 g Oral Daily   rosuvastatin  40 mg Oral QHS   tamsulosin  0.4 mg Oral Daily   Warfarin - Pharmacist Dosing Inpatient   Does not apply q1600    Infusions:  ceFEPime (MAXIPIME) IV Stopped (06/06/22 0959)   phytonadione (VITAMIN K) 10 mg in dextrose 5 % 50 mL IVPB      No Known Allergies  Social History   Socioeconomic History   Marital status: Widowed    Spouse name: Not on file   Number of children: Not on file   Years of education: Not on file   Highest education level: Not on file  Occupational History   Not on file  Tobacco Use   Smoking status: Never   Smokeless tobacco: Never  Substance and Sexual Activity   Alcohol use: No   Drug use: No   Sexual activity: Not on file  Other Topics Concern   Not on file  Social History Narrative   Not on file   Social Determinants of Health   Financial Resource Strain: Not on file  Food Insecurity: Not on file  Transportation Needs: Not on file  Physical Activity: Not on file  Stress: Not on file  Social Connections: Not on file  Intimate Partner Violence: Not on file    History reviewed. No pertinent family  history.  PHYSICAL EXAM: Vitals:   06/06/22 1030 06/06/22 1100  BP: (!) 107/47 (!) 113/45  Pulse: (!) 42 (!) 44  Resp:  18  Temp:    SpO2: 98% 100%     Intake/Output Summary (Last 24 hours) at 06/06/2022 1140 Last data filed at 06/06/2022 0959 Gross per 24 hour  Intake 1749.69 ml  Output 1850 ml  Net -100.31 ml    General:  Well appearing. No respiratory difficulty HEENT: normal Neck: supple. no JVD. Carotids 2+ bilat; no bruits. No lymphadenopathy or thryomegaly appreciated. Cor: PMI nondisplaced. Regular rate & rhythm. No rubs, gallops or murmurs. Lungs: clear Abdomen: soft, nontender, nondistended. No hepatosplenomegaly. No bruits or masses. Good bowel sounds. Extremities: no cyanosis, clubbing, rash, edema Neuro: alert & oriented x 3, cranial nerves grossly intact. moves all 4 extremities w/o difficulty. Affect pleasant.  ECG: Sinus bradycardia 34 bpm with A-V dissociation suggestive of complete heart block  Results for orders placed or performed during the hospital encounter of 06/05/22 (from the past 24 hour(s))  Lactic acid, plasma     Status: None   Collection Time: 06/05/22  3:00 PM  Result Value Ref Range   Lactic Acid, Venous 1.1 0.5 - 1.9 mmol/L  CK     Status: Abnormal   Collection Time: 06/05/22  8:27 PM  Result Value Ref Range   Total CK 1,513 (H) 49 - 397 U/L  Protime-INR     Status: Abnormal   Collection Time: 06/05/22  8:27 PM  Result Value Ref Range   Prothrombin Time 37.0 (H) 11.4 - 15.2 seconds   INR 3.8 (H) 0.8 - 1.2  Troponin I (High Sensitivity)     Status: Abnormal   Collection Time: 06/05/22  8:27 PM  Result Value Ref Range   Troponin I (High Sensitivity) 222 (HH) <18 ng/L  Troponin I (High Sensitivity)     Status: Abnormal   Collection Time: 06/05/22 10:07 PM  Result Value Ref Range   Troponin I (High Sensitivity) 240 (HH) <18 ng/L  Troponin I (High Sensitivity)     Status: Abnormal   Collection Time: 06/06/22  4:12 AM  Result Value Ref  Range   Troponin I (High Sensitivity) 266 (HH) <18 ng/L  CBC     Status: Abnormal   Collection Time: 06/06/22  4:12 AM  Result Value Ref Range   WBC 7.4 4.0 - 10.5 K/uL   RBC 3.15 (L) 4.22 - 5.81 MIL/uL   Hemoglobin 9.2 (L) 13.0 - 17.0 g/dL   HCT 28.9 (L) 39.0 - 52.0 %   MCV 91.7 80.0 - 100.0 fL   MCH 29.2 26.0 - 34.0 pg   MCHC 31.8 30.0 - 36.0 g/dL   RDW 14.7 11.5 - 15.5 %   Platelets 146 (L) 150 - 400 K/uL   nRBC 0.0 0.0 - 0.2 %  Basic metabolic panel     Status: Abnormal   Collection Time: 06/06/22  4:12 AM  Result Value Ref Range   Sodium 138 135 - 145 mmol/L   Potassium 3.4 (L) 3.5 - 5.1 mmol/L   Chloride 108 98 - 111 mmol/L   CO2 23 22 - 32 mmol/L   Glucose, Bld 98 70 - 99 mg/dL   BUN 29 (H) 8 - 23 mg/dL   Creatinine, Ser 1.54 (H) 0.61 - 1.24 mg/dL   Calcium 8.2 (L) 8.9 - 10.3 mg/dL   GFR, Estimated 42 (L) >60 mL/min   Anion gap 7 5 - 15  Protime-INR     Status: Abnormal   Collection Time: 06/06/22  4:12 AM  Result Value Ref Range   Prothrombin Time 37.0 (H) 11.4 - 15.2 seconds   INR 3.8 (H) 0.8 - 1.2  Troponin I (High Sensitivity)     Status: Abnormal   Collection Time: 06/06/22  6:00 AM  Result Value Ref Range   Troponin I (High Sensitivity) 297 (HH) <18 ng/L   CT ABDOMEN PELVIS W CONTRAST  Result Date: 06/05/2022 CLINICAL DATA:  Sepsis generalized weakness and fever. EXAM: CT ABDOMEN AND PELVIS WITH CONTRAST TECHNIQUE: Multidetector CT imaging of the abdomen and pelvis was performed using the standard protocol following bolus administration of intravenous contrast. RADIATION DOSE REDUCTION: This exam was performed according to the departmental dose-optimization program which includes automated exposure control, adjustment of the mA and/or kV according to patient size and/or use of iterative reconstruction technique. CONTRAST:  88mL OMNIPAQUE IOHEXOL 300 MG/ML  SOLN COMPARISON:  None available FINDINGS: Lower chest: Mitral valve replacement. Cardiomegaly. No  pericardial effusion. Epicardial pacer wires are in place. Heart is incompletely imaged. Basilar atelectasis. No dense consolidation. No sign of pleural effusion. Post sternotomy. Hepatobiliary: Hepatic cysts. Smooth hepatic contours. Cyst in the LEFT liver and in the RIGHT  hemiliver. Portal vein is patent. No pericholecystic stranding. Small gallstone in the dependent gallbladder. No biliary duct dilation. Pancreas: Mild pancreatic atrophy. 2.0 x 1.4 cm area of low attenuation measuring 18 Hounsfield units, cystic appearance in the tail of the pancreas. No peripancreatic stranding. No ductal dilation. Spleen: Normal. Adrenals/Urinary Tract: Adrenal glands are normal. Symmetric renal enhancement without hydronephrosis. No perinephric stranding. High density lesion (but less than adjacent renal cortex in terms of density) arises from the posterior cortex of the LEFT kidney, 84 Hounsfield units on venous phase and 66 Hounsfield units on delayed phase (image 38/2). Otherwise without suspicious renal lesion. No no substantial perinephric stranding. Urinary bladder is markedly distended extending up into the abdomen. No perivesical stranding. No gross bladder wall thickening. Distal ureters with limited assessment along with bladder base due to the RIGHT hip arthroplasty the results in considerable streak artifact about the pelvis. Stomach/Bowel: No acute bowel process. Colonic diverticulosis. Appendix is normal. Stomach under distended. Vascular/Lymphatic: Aortic atherosclerosis both calcified and noncalcified. Mild infrarenal abdominal aortic aneurysm at 3.4 x 3.2 cm. There is no gastrohepatic or hepatoduodenal ligament lymphadenopathy. No retroperitoneal or mesenteric lymphadenopathy. No pelvic sidewall lymphadenopathy. Reproductive: Grossly unremarkable by CT but area with considerably limited assessment due to this streak artifact from RIGHT hip arthroplasty. Other: No ascites.  No pneumoperitoneum. Musculoskeletal:  Degenerative changes in the spine moderate to marked with RIGHT hip arthroplasty. Signs of median sternotomy. IMPRESSION: 1. Markedly distended urinary bladder extending up into the abdomen. Correlate with signs of bladder outlet obstruction or urinary retention. Urinary bladder extends well into the low abdomen from the pelvis approximately 16 cm greatest axial dimension. Would also correlate with urinalysis in the context of potential bladder outlet obstruction. 2. Posterior cortical lesion on the LEFT suspicious for small solid renal neoplasm in the lower pole of the LEFT kidney. Could consider comparison with prior imaging, if no prior imaging is available would suggest follow-up CT or MRI at 3-6 months with without contrast for further assessment. 3. Cystic pancreatic lesion without associated high-risk features at this time. Consider comparison with prior imaging or follow-up with MRI in 2 years as warranted. 4. Cholelithiasis without evidence of acute cholecystitis. 5. Mild infrarenal abdominal aortic aneurysm at 3.4 x 3.2 cm. 3.4 cm infrarenal abdominal aortic aneurysm. Recommend follow-up every 3 years. Reference: J Am Coll Radiol 2013;10:789-794. 6. Colonic diverticulosis without evidence of acute diverticulitis. 7. Aortic atherosclerosis. Aortic Atherosclerosis (ICD10-I70.0). Electronically Signed   By: Donzetta Kohut M.D.   On: 06/05/2022 14:19   CT HEAD WO CONTRAST ( )  Result Date: 06/05/2022 CLINICAL DATA:  Mental status change, unknown cause EXAM: CT HEAD WITHOUT CONTRAST TECHNIQUE: Contiguous axial images were obtained from the base of the skull through the vertex without intravenous contrast. RADIATION DOSE REDUCTION: This exam was performed according to the departmental dose-optimization program which includes automated exposure control, adjustment of the mA and/or kV according to patient size and/or use of iterative reconstruction technique. COMPARISON:  CT head 11/26/2017. FINDINGS: Brain:  No evidence of acute infarction, hemorrhage, hydrocephalus, extra-axial collection or mass lesion/mass effect. 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 Vascular: No hyperdense vessel identified. Skull: No acute fracture. Sinuses/Orbits: Clear visualized sinuses.  No acute orbital findings Other: No mastoid effusions. IMPRESSION: No evidence of acute intracranial abnormality. Electronically Signed   By: Feliberto Harts M.D.   On: 06/05/2022 14:11   DG Chest Port 1 View  Result Date: 06/05/2022 CLINICAL DATA:  Sepsis. EXAM: PORTABLE CHEST 1 VIEW COMPARISON:  01/26/2014 FINDINGS: Stable surgical changes from valve replacement surgery. The heart is within normal limits in size given the AP projection, portable technique, low lung volumes and patient's age. Low lung volumes with vascular crowding and bibasilar atelectasis. No pleural effusions. No focal airspace consolidation. IMPRESSION: Low lung volumes with vascular crowding and bibasilar atelectasis. Electronically Signed   By: Marijo Sanes M.D.   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  Patient would need temporary pacemaker implanted since heart rate is in the 30s and 40s.  Patient also would need transesophageal echocardiogram to evaluate the mechanical valve for endocarditis.  Patient is 88 year old but is very much alert and oriented and had a long discussion with him about above procedures with his son Dean Cruz present on bedside.  Initially he said he does not want anything done here because he is treated at [facility] and would like to be transferred.  I tried to make arrangement to transfer but when he called the [place] they are all apparently on divert.  I have been told that [facility] will pay for all his stay here as well as procedures.  I went back and talked to the patient and Dean Cruz his son and apparently they are in  agreement that they would like to have a temporary pacemaker put in and down the road if he needs transesophageal echocardiogram.  However his INR is 3.8.  Thus pacemaker cannot be placed right now and would have to rely on external pacing for the time being.  If she deteriorates FFP can be given and then temporary pacemaker could be implanted.  If she gets to a situation that he needs mechanical valve replaced I have explained to the patient and his son that he would have to be sent to [facility] in [place]. Reanne Nellums A

## 2022-06-06 NOTE — Progress Notes (Signed)
Frequent Episode of pacer not capturing , resolves after 8 beats, patient complaining of chest pain ever time pacer does not capture . Contacted dR

## 2022-06-06 NOTE — Consult Note (Signed)
ANTICOAGULATION CONSULT NOTE - Initial Consult  Pharmacy Consult for Warfarin Indication:  Mechanical Mitral Valve   No Known Allergies  Patient Measurements: Height: 5\' 6"  (167.6 cm) Weight: 78.5 kg (173 lb 1 oz) IBW/kg (Calculated) : 63.8   Vital Signs: Temp: 97.9 F (36.6 C) (07/26 1541) Temp Source: Oral (07/26 1541) BP: 112/58 (07/26 1600) Pulse Rate: 69 (07/26 1600)  Labs: Recent Labs    06/05/22 1051 06/05/22 2027 06/05/22 2207 06/06/22 0412 06/06/22 0600 06/06/22 1603  HGB 10.4*  --   --  9.2*  --   --   HCT 31.9*  --   --  28.9*  --   --   PLT 170  --   --  146*  --   --   LABPROT  --  37.0*  --  37.0*  --  21.3*  INR  --  3.8*  --  3.8*  --  1.9*  CREATININE 1.50*  --   --  1.54*  --   --   CKTOTAL  --  1,513*  --   --   --   --   TROPONINIHS 100* 222* 240* 266* 297*  --      Estimated Creatinine Clearance: 29.5 mL/min (A) (by C-G formula based on SCr of 1.54 mg/dL (H)).   Medical History: Past Medical History:  Diagnosis Date   CHF (congestive heart failure) (HCC)    Hypertension     Medications:  Medications Prior to Admission  Medication Sig Dispense Refill Last Dose   acetaminophen (TYLENOL) 500 MG tablet Take 2 tablets by mouth every 6 (six) hours as needed.   06/04/2022   amLODipine (NORVASC) 10 MG tablet Take 5 mg by mouth daily.   06/04/2022   Aspirin 81 MG CAPS Take 1 tablet by mouth daily.   06/04/2022   ferrous sulfate 325 (65 FE) MG EC tablet Take 324 mg by mouth daily with breakfast.   06/04/2022   finasteride (PROSCAR) 5 MG tablet Take 5 mg by mouth daily.   06/04/2022   levothyroxine (SYNTHROID) 100 MCG tablet Take 100 mcg by mouth daily before breakfast.   06/04/2022   lisinopril (PRINIVIL,ZESTRIL) 40 MG tablet Take 40 mg by mouth daily.   06/04/2022   polyethylene glycol (MIRALAX / GLYCOLAX) 17 g packet Take 17 g by mouth daily.   06/04/2022   rosuvastatin (CRESTOR) 40 MG tablet Take 40 mg by mouth at bedtime.   06/04/2022   tamsulosin  (FLOMAX) 0.4 MG CAPS capsule Take 1 capsule by mouth daily.   06/04/2022   vitamin C (ASCORBIC ACID) 500 MG tablet Take 500 mg by mouth 2 (two) times daily. Noon and bedtime   06/04/2022   warfarin (COUMADIN) 2.5 MG tablet Take 2.5 mg by mouth daily. Take one tablet by mouth every day, except take 1.25 Tuesday.   06/04/2022   metoprolol succinate (TOPROL-XL) 25 MG 24 hr tablet Take 25 mg by mouth daily. (Patient not taking: Reported on 06/05/2022)   Not Taking   Scheduled:   ascorbic acid  500 mg Oral BID   aspirin EC  81 mg Oral Daily   Chlorhexidine Gluconate Cloth  6 each Topical Daily   ferrous sulfate  324 mg Oral Q breakfast   finasteride  5 mg Oral Daily   levothyroxine  100 mcg Oral QAC breakfast   polyethylene glycol  17 g Oral Daily   rosuvastatin  40 mg Oral QHS   tamsulosin  0.4 mg Oral Daily  Warfarin - Pharmacist Dosing Inpatient   Does not apply q1600   Infusions:   ceFEPime (MAXIPIME) IV Stopped (06/06/22 0959)   PRN: acetaminophen, atropine, ondansetron **OR** ondansetron (ZOFRAN) IV  Assessment: Pharmacy consulted to restart warfarin for mechanical Mitral valve. Pt takes warfarin 2.5 mg 6 days/week and  1.25 mg on Tuesdays. per med rec. No major DDI PTA, but pt is started on cefepime.   Medications: 7/26 1300 Vitamin K 10 mg IV x 1    Date INR Warfarin Dose  7/25 3.8 1 mg  7/26 3.8 > 1.9 5 mg      Goal of Therapy:  INR 2.5 - 3.5 Monitor platelets by anticoagulation protocol: Yes   Plan:  INR is subtherapeutic following IV Vitamin 10 mg x 1 given 7/26 Will give increased dose of 5 mg x 1 tonight Daily INR. CBC at least every 3 days.   Sharen Hones, PharmD, BCPS Clinical Pharmacist   06/06/2022,5:27 PM

## 2022-06-06 NOTE — Progress Notes (Signed)
PHARMACY - PHYSICIAN COMMUNICATION CRITICAL VALUE ALERT - BLOOD CULTURE IDENTIFICATION (BCID)  Results for orders placed or performed during the hospital encounter of 06/05/22  Blood Culture (routine x 2)     Status: Abnormal (Preliminary result)   Collection Time: 06/05/22 10:51 AM   Specimen: BLOOD  Result Value Ref Range Status   Specimen Description BLOOD LEFT ANTECUBITAL  Final   Special Requests   Final    BOTTLES DRAWN AEROBIC AND ANAEROBIC Blood Culture results may not be optimal due to an excessive volume of blood received in culture bottles   Culture  Setup Time   Final    Organism ID to follow IN BOTH AEROBIC AND ANAEROBIC BOTTLES CRITICAL RESULT CALLED TO, READ BACK BY AND VERIFIED WITH: GRAM NEGATIVE DIPLOCOCCI Performed at [facility], [address] [place], [place] [address]    Culture GRAM NEGATIVE DIPLOCOCCI (A)  Final   Report Status PENDING  Incomplete  Blood Culture (routine x 2)     Status: Abnormal (Preliminary result)   Collection Time: 06/05/22 10:52 AM   Specimen: BLOOD  Result Value Ref Range Status   Specimen Description BLOOD RIGHT ANTECUBITAL  Final   Special Requests   Final    BOTTLES DRAWN AEROBIC AND ANAEROBIC Blood Culture results may not be optimal due to an excessive volume of blood received in culture bottles   Culture  Setup Time   Final    IN BOTH AEROBIC AND ANAEROBIC BOTTLES CRITICAL VALUE NOTED.  VALUE IS CONSISTENT WITH PREVIOUSLY REPORTED AND CALLED VALUE. GRAM NEGATIVE DIPLOCOCCI Performed at [facility], [address] [place], [place] [address]    Culture GRAM NEGATIVE DIPLOCOCCI (A)  Final   Report Status PENDING  Incomplete  Resp Panel by RT-PCR (Flu A&B, Covid) Anterior Nasal Swab     Status: None   Collection Time: 06/05/22 10:52 AM   Specimen: Anterior Nasal Swab  Result Value Ref Range Status   SARS Coronavirus 2 by RT PCR NEGATIVE NEGATIVE Final    Comment: (NOTE) SARS-CoV-2 target nucleic acids  are NOT DETECTED.  The SARS-CoV-2 RNA is generally detectable in upper respiratory specimens during the acute phase of infection. The lowest concentration of SARS-CoV-2 viral copies this assay can detect is 138 copies/mL. A negative result does not preclude SARS-Cov-2 infection and should not be used as the sole basis for treatment or other patient management decisions. A negative result may occur with  improper specimen collection/handling, submission of specimen other than nasopharyngeal swab, presence of viral mutation(s) within the areas targeted by this assay, and inadequate number of viral copies(<138 copies/mL). A negative result must be combined with clinical observations, patient history, and epidemiological information. The expected result is Negative.  Fact Sheet for Patients:  BloggerCourse.com  Fact Sheet for Healthcare Providers:  SeriousBroker.it  This test is no t yet approved or cleared by the [place] FDA and  has been authorized for detection and/or diagnosis of SARS-CoV-2 by FDA under an Emergency Use Authorization (EUA). This EUA will remain  in effect (meaning this test can be used) for the duration of the COVID-19 declaration under Section 564(b)(1) of the Act, 21 U.S.C.section 360bbb-3(b)(1), unless the authorization is terminated  or revoked sooner.       Influenza A by PCR NEGATIVE NEGATIVE Final   Influenza B by PCR NEGATIVE NEGATIVE Final    Comment: (NOTE) The Xpert Xpress SARS-CoV-2/FLU/RSV plus assay is intended as an aid in the diagnosis of influenza from Nasopharyngeal swab  specimens and should not be used as a sole basis for treatment. Nasal washings and aspirates are unacceptable for Xpert Xpress SARS-CoV-2/FLU/RSV testing.  Fact Sheet for Patients: BloggerCourse.com  Fact Sheet for Healthcare Providers: SeriousBroker.it  This test is  not yet approved or cleared by the [place] FDA and has been authorized for detection and/or diagnosis of SARS-CoV-2 by FDA under an Emergency Use Authorization (EUA). This EUA will remain in effect (meaning this test can be used) for the duration of the COVID-19 declaration under Section 564(b)(1) of the Act, 21 U.S.C. section 360bbb-3(b)(1), unless the authorization is terminated or revoked.  Performed at [facility], [address] [place], [place] [address]    BCID results:  4 of 4 bottles w/ Gram Negative Diplococci.  Lab send to [facility] for further ID.  Pt currently ordered Cefepime.  Name of provider contacted: Docia Furl, NP   Changes to prescribed antibiotics required: Continue Cefepime pending further ID.  Otelia Sergeant, PharmD, [facility] 06/06/2022 4:15 AM

## 2022-06-06 NOTE — Consult Note (Addendum)
ANTICOAGULATION CONSULT NOTE - Initial Consult  Pharmacy Consult for Warfarin Indication:  Mechanical Mitral Valve   No Known Allergies  Patient Measurements: Height: 5\' 8"  (172.7 cm) Weight: 83 kg (182 lb 15.7 oz) IBW/kg (Calculated) : 68.4   Vital Signs: Temp: 97.9 F (36.6 C) (07/26 0553) Temp Source: Oral (07/26 0553) BP: 112/53 (07/26 0730) Pulse Rate: 80 (07/26 0730)  Labs: Recent Labs    06/05/22 1051 06/05/22 2027 06/05/22 2207 06/06/22 0412 06/06/22 0600  HGB 10.4*  --   --  9.2*  --   HCT 31.9*  --   --  28.9*  --   PLT 170  --   --  146*  --   LABPROT  --  37.0*  --  37.0*  --   INR  --  3.8*  --  3.8*  --   CREATININE 1.50*  --   --  1.54*  --   CKTOTAL  --  1,513*  --   --   --   TROPONINIHS 100* 222* 240* 266* 297*     Estimated Creatinine Clearance: 31.5 mL/min (A) (by C-G formula based on SCr of 1.54 mg/dL (H)).   Medical History: Past Medical History:  Diagnosis Date   CHF (congestive heart failure) (HCC)    Hypertension     Medications:  (Not in a hospital admission) Scheduled:   vitamin C  500 mg Oral BID   aspirin EC  81 mg Oral Daily   ferrous sulfate  324 mg Oral Q breakfast   finasteride  5 mg Oral Daily   levothyroxine  100 mcg Oral QAC breakfast   polyethylene glycol  17 g Oral Daily   rosuvastatin  40 mg Oral QHS   tamsulosin  0.4 mg Oral Daily   Warfarin - Pharmacist Dosing Inpatient   Does not apply q1600   Infusions:   ceFEPime (MAXIPIME) IV Stopped (06/05/22 1652)   PRN: acetaminophen, atropine, ondansetron **OR** ondansetron (ZOFRAN) IV  Assessment: Pharmacy consulted to restart warfarin for mechanical Mitral valve. Pt takes warfarin 2.5 mg 6 days/week and  1.25 mg on Tuesdays. er med rec. No major DDI PTA, but pt is started on cefepime.   Date INR Warfarin Dose  7/25 3.8 1 mg  7/26 3.8 1 mg      Goal of Therapy:  INR 2.5 - 3.5 Monitor platelets by anticoagulation protocol: Yes   Plan:  INR is  supratherapeutic.  Will hold warfarin tonight Daily INR. CBC at least every 3 days.   8/26, PharmD, BCPS Clinical Pharmacist   06/06/2022,8:01 AM

## 2022-06-06 NOTE — Consult Note (Signed)
NAME: Dean Cruz  DOB: 03/07/1927  MRN: [id number]  Date/Time: 06/06/2022 9:50 PM  REQUESTING PROVIDER: Dr.Amin Subjective:  REASON FOR CONSULT: gram neg bacteremia ? Dean Cruz is a 88 y.o. male with a history of CHF,mitral valve replacement , BPH with self catheterization presented to the ED thru EMS with weakness, fall inability to get off the floor As per patient he has not been feeling well for 2 days- no energy, feeling weak.  He was sitting in chair and fell out of it on the floor and could not get up until his granddaughter found him and called EMS He denied cough, sob, chest pain , dizziness, palpitations He does self catheterization of bladder twice a day- he passes urine as well He did not catheterize yesterday Vitals in the ED 108/51, TEMP 101.6, pulse wa slow at 46 Cr 1.50, Hb 10.4, WBC 21 EKG complete AV block CT abdomen revealed a very distended bladder and he had a foley placed with drainage of 1 l of urine Blood culture and urine culture sent As blood gram stain positive for gram neg diplococci but negative meningococcus , I am seeing the patient for the same He is currently on cefepime HE gives a history of cat bit 4 days ago- by his granddaughter's cat. Nipped on his left leg- he has no pain.   He had been to the [place] yesterday for monitoring labs As he was in Heart block was seen by cardiologist and taken for temporary pacemaker because of high INR permanent pacemaker could not be placed today   Past Medical History:  Diagnosis Date   CHF (congestive heart failure) ([place])    Hypertension     Past Surgical History:  Procedure Laterality Date   CARDIAC SURGERY     VALVE REPLACEMENT      Social History   Socioeconomic History   Marital status: Widowed    Spouse name: Not on file   Number of children: Not on file   Years of education: Not on file   Highest education level: Not on file  Occupational History   Not on file  Tobacco Use   Smoking  status: Never   Smokeless tobacco: Never  Substance and Sexual Activity   Alcohol use: No   Drug use: No   Sexual activity: Not on file  Other Topics Concern   Not on file  Social History Narrative   Not on file   Social Determinants of Health   Financial Resource Strain: Not on file  Food Insecurity: Not on file  Transportation Needs: Not on file  Physical Activity: Not on file  Stress: Not on file  Social Connections: Not on file  Intimate Partner Violence: Not on file    History reviewed. No pertinent family history. No Known Allergies I? Current Facility-Administered Medications  Medication Dose Route Frequency Provider Last Rate Last Admin   0.9 %  sodium chloride infusion   Intravenous Continuous Neoma Laming A, MD 10 mL/hr at 06/06/22 1842 Infusion Verify at 06/06/22 1842   acetaminophen (TYLENOL) tablet 1,000 mg  1,000 mg Oral Q6H PRN Agbata, Tochukwu, MD   1,000 mg at 06/06/22 0022   ascorbic acid (VITAMIN C) tablet 500 mg  500 mg Oral BID Agbata, Tochukwu, MD   500 mg at 06/05/22 2313   aspirin EC tablet 81 mg  81 mg Oral Daily Agbata, Tochukwu, MD   81 mg at 06/06/22 0929   atropine 1 MG/10ML injection 1 mg  1 mg Intravenous  PRN Foust, Katy L, NP       ceFEPIme (MAXIPIME) 2 g in sodium chloride 0.9 % 100 mL IVPB  2 g Intravenous Q12H Wynelle Cleveland, RPH   Stopped at 06/06/22 [id number]   Chlorhexidine Gluconate Cloth 2 % PADS 6 each  6 each Topical Daily Lorella Nimrod, MD   6 each at 06/06/22 1533   ferrous sulfate tablet 324 mg  324 mg Oral Q breakfast Agbata, Tochukwu, MD   324 mg at 06/06/22 0814   finasteride (PROSCAR) tablet 5 mg  5 mg Oral Daily Agbata, Tochukwu, MD   5 mg at 06/06/22 [id number]   levothyroxine (SYNTHROID) tablet 100 mcg  100 mcg Oral QAC breakfast Agbata, Tochukwu, MD   100 mcg at 06/06/22 0548   ondansetron (ZOFRAN) tablet 4 mg  4 mg Oral Q6H PRN Agbata, Tochukwu, MD       Or   ondansetron (ZOFRAN) injection 4 mg  4 mg Intravenous Q6H PRN Agbata,  Tochukwu, MD       polyethylene glycol (MIRALAX / GLYCOLAX) packet 17 g  17 g Oral Daily Agbata, Tochukwu, MD   17 g at 06/06/22 0929   rosuvastatin (CRESTOR) tablet 40 mg  40 mg Oral QHS Agbata, Tochukwu, MD   40 mg at 06/05/22 2313   tamsulosin (FLOMAX) capsule 0.4 mg  0.4 mg Oral Daily Agbata, Tochukwu, MD   0.4 mg at 06/06/22 [id number]   warfarin (COUMADIN) tablet 5 mg  5 mg Oral Once Dorothe Pea, [facility]       Warfarin - Pharmacist Dosing Inpatient   Does not apply q1600 Agbata, Tochukwu, MD         Abtx:  Anti-infectives (From admission, onward)    Start     Dose/Rate Route Frequency Ordered Stop   06/05/22 1615  ceFEPIme (MAXIPIME) 2 g in sodium chloride 0.9 % 100 mL IVPB        2 g 200 mL/hr over 30 Minutes Intravenous Every 12 hours 06/05/22 1602     06/05/22 1600  ceFEPIme (MAXIPIME) 2 g in sodium chloride 0.9 % 100 mL IVPB  Status:  Discontinued        2 g 200 mL/hr over 30 Minutes Intravenous  Once 06/05/22 1549 06/05/22 1602   06/05/22 1230  cefTRIAXone (ROCEPHIN) 2 g in sodium chloride 0.9 % 100 mL IVPB  Status:  Discontinued        2 g 200 mL/hr over 30 Minutes Intravenous Every 24 hours 06/05/22 1215 06/05/22 1600   06/05/22 1230  azithromycin (ZITHROMAX) 500 mg in sodium chloride 0.9 % 250 mL IVPB  Status:  Discontinued        500 mg 250 mL/hr over 60 Minutes Intravenous Every 24 hours 06/05/22 1215 06/05/22 1600   06/05/22 1230  vancomycin (VANCOREADY) IVPB 1750 mg/350 mL        1,750 mg 175 mL/hr over 120 Minutes Intravenous  Once 06/05/22 1229 06/05/22 1626       REVIEW OF SYSTEMS:  Const: negative fever, negative chills, negative weight loss Eyes: negative diplopia or visual changes, negative eye pain ENT: negative coryza, negative sore throat Resp: negative cough, hemoptysis, dyspnea Cards: negative for chest pain, palpitations, lower extremity edema GU: urinary retention GI: Negative for abdominal pain, diarrhea, bleeding, constipation Skin: negative for rash  and pruritus Heme: negative for easy bruising and gum/nose bleeding MS: weakness Neurolo:fall, confusion  Psych: negative for feelings of anxiety, depression  Endocrine: negative for thyroid, diabetes Allergy/Immunology- negative for  any medication or food allergies  Objective:  VITALS:  BP (!) 124/54   Pulse 70   Temp 98 F (36.7 C) (Axillary)   Resp 19   Ht 5\' 6"  (1.676 m)   Wt 78.5 kg   SpO2 97%   BMI 27.93 kg/m   PHYSICAL EXAM:  General: Alert, cooperative, no distress, appears young for age.  Head: Normocephalic, without obvious abnormality, atraumatic. Eyes: Conjunctivae clear, anicteric sclerae. Pupils are equal ENT Nares normal. No drainage or sinus tenderness. Lips, mucosa, and tongue normal. No Thrush Full set of dentures Neck: Supple, symmetrical, no adenopathy, thyroid: non tender no carotid bruit and no JVD. Back: No CVA tenderness. Lungs: Clear to auscultation bilaterally. No Wheezing or Rhonchi. No rales. Heart: bradycardia Abdomen: Soft, non-tender,not distended. Bowel sounds normal. No masses Extremities: left leg erythema, warmth, venous discoloration    Scab rt leg Skin: No rashes or lesions. Or bruising Lymph: Cervical, supraclavicular normal. Neurologic: Grossly non-focal Pertinent Labs Lab Results CBC    Component Value Date/Time   WBC 7.4 06/06/2022 0412   RBC 3.15 (L) 06/06/2022 0412   HGB 9.2 (L) 06/06/2022 0412   HGB 11.5 (L) 01/26/2014 0258   HCT 28.9 (L) 06/06/2022 0412   HCT 35.8 (L) 01/26/2014 0258   PLT 146 (L) 06/06/2022 0412   PLT 186 01/26/2014 0258   MCV 91.7 06/06/2022 0412   MCV 86 01/26/2014 0258   MCH 29.2 06/06/2022 0412   MCHC 31.8 06/06/2022 0412   RDW 14.7 06/06/2022 0412   RDW 14.6 (H) 01/26/2014 0258   LYMPHSABS 0.3 (L) 06/05/2022 1051   LYMPHSABS 1.8 01/26/2014 0258   MONOABS 0.3 06/05/2022 1051   MONOABS 0.6 01/26/2014 0258   EOSABS 0.0 06/05/2022 1051   EOSABS 0.3 01/26/2014 0258   BASOSABS 0.0  06/05/2022 1051   BASOSABS 0.1 01/26/2014 0258       Latest Ref Rng & Units 06/06/2022    4:12 AM 06/05/2022   10:51 AM 11/26/2017    3:10 AM  CMP  Glucose 70 - 99 mg/dL 98  138  126   BUN 8 - 23 mg/dL 29  32  32   Creatinine 0.61 - 1.24 mg/dL 1.54  1.50  1.20   Sodium 135 - 145 mmol/L 138  138  138   Potassium 3.5 - 5.1 mmol/L 3.4  3.9  4.2   Chloride 98 - 111 mmol/L 108  108  108   CO2 22 - 32 mmol/L 23  21  24    Calcium 8.9 - 10.3 mg/dL 8.2  8.7  8.4   Total Protein 6.5 - 8.1 g/dL  6.9    Total Bilirubin 0.3 - 1.2 mg/dL  1.1    Alkaline Phos 38 - 126 U/L  66    AST 15 - 41 U/L  21    ALT 0 - 44 U/L  13        Microbiology: Recent Results (from the past 240 hour(s))  Blood Culture (routine x 2)     Status: Abnormal (Preliminary result)   Collection Time: 06/05/22 10:51 AM   Specimen: BLOOD  Result Value Ref Range Status   Specimen Description BLOOD LEFT ANTECUBITAL  Final   Special Requests   Final    BOTTLES DRAWN AEROBIC AND ANAEROBIC Blood Culture results may not be optimal due to an excessive volume of blood received in culture bottles   Culture  Setup Time   Final    Organism ID to follow IN BOTH  AEROBIC AND ANAEROBIC BOTTLES CRITICAL RESULT CALLED TO, READ BACK BY AND VERIFIED WITH: GRAM NEGATIVE DIPLOCOCCI Performed at [facility], [address] [place], [place] [address]    Culture GRAM NEGATIVE DIPLOCOCCI (A)  Final   Report Status PENDING  Incomplete  Urine Culture     Status: None (Preliminary result)   Collection Time: 06/05/22 10:51 AM   Specimen: In/Out Cath Urine  Result Value Ref Range Status   Specimen Description   Final    IN/OUT CATH URINE Performed at [facility], [address] [place], [place] [address]    Special Requests   Final    NONE Performed at [facility], [address] [place], [place] [address]    Culture   Final    CULTURE REINCUBATED FOR BETTER GROWTH Performed at [place], [place] [address] [place], [place] [address]    Report Status PENDING  Incomplete  Blood Culture ID Panel (Reflexed)     Status: None   Collection Time: 06/05/22 10:51 AM  Result Value Ref Range Status   Enterococcus faecalis NOT DETECTED NOT DETECTED Final   Enterococcus Faecium NOT DETECTED NOT DETECTED Final   Listeria monocytogenes NOT DETECTED NOT DETECTED Final   Staphylococcus species NOT DETECTED NOT DETECTED Final   Staphylococcus aureus (BCID) NOT DETECTED NOT DETECTED Final   Staphylococcus epidermidis NOT DETECTED NOT DETECTED Final   Staphylococcus lugdunensis NOT DETECTED NOT DETECTED Final   Streptococcus species NOT DETECTED NOT DETECTED Final   Streptococcus agalactiae NOT DETECTED NOT DETECTED Final   Streptococcus pneumoniae NOT DETECTED NOT DETECTED Final   Streptococcus pyogenes NOT DETECTED NOT DETECTED Final   A.calcoaceticus-baumannii NOT DETECTED NOT DETECTED Final   Bacteroides fragilis NOT DETECTED NOT DETECTED Final   Enterobacterales NOT DETECTED NOT DETECTED Final   Enterobacter cloacae complex NOT DETECTED NOT DETECTED Final   Escherichia coli NOT DETECTED NOT DETECTED Final   Klebsiella aerogenes NOT DETECTED NOT DETECTED Final   Klebsiella oxytoca NOT DETECTED NOT DETECTED Final   Klebsiella pneumoniae NOT DETECTED NOT DETECTED Final   Proteus species NOT DETECTED NOT DETECTED Final   Salmonella species NOT DETECTED NOT DETECTED Final   Serratia marcescens NOT DETECTED NOT DETECTED Final   Haemophilus influenzae NOT DETECTED NOT DETECTED Final   Neisseria meningitidis NOT DETECTED NOT DETECTED Final   Pseudomonas aeruginosa NOT DETECTED NOT DETECTED Final   Stenotrophomonas maltophilia NOT DETECTED NOT DETECTED Final   Candida albicans NOT DETECTED NOT DETECTED Final   Candida auris NOT DETECTED NOT DETECTED Final   Candida glabrata NOT DETECTED NOT DETECTED Final   Candida krusei NOT DETECTED NOT DETECTED Final   Candida parapsilosis NOT DETECTED  NOT DETECTED Final   Candida tropicalis NOT DETECTED NOT DETECTED Final   Cryptococcus neoformans/gattii NOT DETECTED NOT DETECTED Final    Comment: Performed at [facility], [place] [address], [place], [place] [address]  Blood Culture (routine x 2)     Status: Abnormal (Preliminary result)   Collection Time: 06/05/22 10:52 AM   Specimen: BLOOD  Result Value Ref Range Status   Specimen Description   Final    BLOOD RIGHT ANTECUBITAL Performed at [facility], [address] [place], [place] [address]    Special Requests   Final    BOTTLES DRAWN AEROBIC AND ANAEROBIC Blood Culture results may not be optimal due to an excessive volume of blood received in culture bottles Performed at [facility], [address] [place], [place] [address]    Culture  Setup Time   Final    IN BOTH AEROBIC AND ANAEROBIC BOTTLES CRITICAL VALUE NOTED.  VALUE IS CONSISTENT WITH PREVIOUSLY REPORTED AND CALLED VALUE. GRAM NEGATIVE DIPLOCOCCI    Culture GRAM NEGATIVE DIPLOCOCCI (A)  Final   Report Status PENDING  Incomplete  Resp Panel by RT-PCR (Flu A&B, Covid) Anterior Nasal Swab     Status: None   Collection Time: 06/05/22 10:52 AM   Specimen: Anterior Nasal Swab  Result Value Ref Range Status   SARS Coronavirus 2 by RT PCR NEGATIVE NEGATIVE Final    Comment: (NOTE) SARS-CoV-2 target nucleic acids are NOT DETECTED.  The SARS-CoV-2 RNA is generally detectable in upper respiratory specimens during the acute phase of infection. The lowest concentration of SARS-CoV-2 viral copies this assay can detect is 138 copies/mL. A negative result does not preclude SARS-Cov-2 infection and should not be used as the sole basis for treatment or other patient management decisions. A negative result may occur with  improper specimen collection/handling, submission of specimen other than nasopharyngeal swab, presence of viral mutation(s) within the areas targeted by this assay, and inadequate  number of viral copies(<138 copies/mL). A negative result must be combined with clinical observations, patient history, and epidemiological information. The expected result is Negative.  Fact Sheet for Patients:  BloggerCourse.com  Fact Sheet for Healthcare Providers:  SeriousBroker.it  This test is no t yet approved or cleared by the [place] FDA and  has been authorized for detection and/or diagnosis of SARS-CoV-2 by FDA under an Emergency Use Authorization (EUA). This EUA will remain  in effect (meaning this test can be used) for the duration of the COVID-19 declaration under Section 564(b)(1) of the Act, 21 U.S.C.section 360bbb-3(b)(1), unless the authorization is terminated  or revoked sooner.       Influenza A by PCR NEGATIVE NEGATIVE Final   Influenza B by PCR NEGATIVE NEGATIVE Final    Comment: (NOTE) The Xpert Xpress SARS-CoV-2/FLU/RSV plus assay is intended as an aid in the diagnosis of influenza from Nasopharyngeal swab specimens and should not be used as a sole basis for treatment. Nasal washings and aspirates are unacceptable for Xpert Xpress SARS-CoV-2/FLU/RSV testing.  Fact Sheet for Patients: BloggerCourse.com  Fact Sheet for Healthcare Providers: SeriousBroker.it  This test is not yet approved or cleared by the [place] FDA and has been authorized for detection and/or diagnosis of SARS-CoV-2 by FDA under an Emergency Use Authorization (EUA). This EUA will remain in effect (meaning this test can be used) for the duration of the COVID-19 declaration under Section 564(b)(1) of the Act, 21 U.S.C. section 360bbb-3(b)(1), unless the authorization is terminated or revoked.  Performed at [facility], [address] [place], [place] [address]   MRSA Next Gen by PCR, Nasal     Status: None   Collection Time: 06/06/22  3:31 PM   Specimen: Nasal  Mucosa; Nasal Swab  Result Value Ref Range Status   MRSA by PCR Next Gen NOT DETECTED NOT DETECTED Final    Comment: (NOTE) The GeneXpert MRSA Assay (FDA approved for NASAL specimens only), is one component of a comprehensive MRSA colonization surveillance program. It is not intended to diagnose MRSA infection nor to guide or monitor treatment for MRSA infections. Test performance is not FDA approved in patients less than 71 years old. 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 Impression/Recommendation ? ?Gram neg bacteremia- ID pending Could be pasteurella, moraxella as he had a cat bite Or other organisms as he had urinary retention and risk of infection from self catheterization Pt currently on cefepime- change to zosyn As he as mechanical heart valve needs 2 d echo/may be TEE to r/o endocarditis  Heart block with severe bradycardia - has temporary pacemaker  Fall and inability to get off the floor  Acute urinary retention with underlying BPH and self- cath- risk for UTI- await culture Has a foley  On finasteride and flomax   Mitral valve replacement on coumadin- risk for valve infection- need TEE    ? ___________________________________________________ Discussed with patient, requesting provider Note:  This document was prepared using Dragon voice recognition software and may include unintentional dictation errors.

## 2022-06-06 NOTE — Progress Notes (Signed)
*  PRELIMINARY RESULTS* Echocardiogram 2D Echocardiogram has been performed.  Cristela Blue 06/06/2022, 1:31 PM

## 2022-06-06 NOTE — Progress Notes (Signed)
Progress Note   Patient: Dean Cruz OBS:[id number] DOB: [birth year] DOA: 06/05/2022     1 DOS: the patient was seen and examined on 06/06/2022   Brief hospital course: Taken from H&P.  Dean Cruz is a 88 y.o. male with medical history significant for chronic diastolic dysfunction CHF, hypertension, BPH with history of urinary retention and self-catheterization twice daily 6 at home who was brought into the ER by EMS for evaluation of generalized weakness. Patient states that he woke up on the morning of his admission and was trying to put his socks on when he slid out off his chair landing on the floor.  He was too weak to get up and so he sat on the floor for about an hour until his granddaughter came home and found him.  She called EMS and he was found to be febrile with a Tmax of 101.5 and hypotensive with a blood pressure of 97/50. He states that he was nauseous in route to the hospital and had an episode of emesis in the ambulance.  His oral intake has been poor over the last several days and he complains of feeling very weak but denies any dizziness or lightheadedness. He denies having any chest pain, no shortness of breath, no headache, no changes in his bowel habits, no leg swelling, no blurred vision or focal deficit. He had a recent cat bite involving his left leg and has some redness over the left anterior leg.   Per EMS patient had room air pulse oximetry of 90% and was placed on 2 L of oxygen.  Patient met sepsis criteria with fever and tachypnea.  Mild lactic acidosis at 2.1, UA with significant pyuria, bacteriuria and hematuria.  Blood cultures with gram-negative diplococci.  Creatinine of 1.5 with unknown baseline as his care was in [place]. Patient received cefepime and vancomycin in ED and was continued on cefepime.  7/26: Patient overnight developed symptomatic bradycardia requiring one-time dose of atropine.  This morning he was still feeling very lethargic, EKG was  obtained due to heart rate being in high 80s to low 40s and found to have complete heart block.  Per patient his normal heart rate is normal in 60s to 70s.  Cardiology was consulted and a temporary percutaneous pacemaker was placed.  He was also given vitamin K for supratherapeutic INR and will need a pacemaker once INR normalizes. ID was also consulted for concern of Pasteurella bacteremia.  Patient's son wants him to be transferred to [place]. will not be safe at this time but we will contact them to see if that is a possibility.   Assessment and Plan: * Sepsis  As evidenced by fever with a Tmax of 101.6, relative hypotension with systolic blood pressure in the 90s, lactic acidosis and pyuria. Patient has a history of BPH with urinary retention and does self-catheterization twice a day.  No urinary symptoms. Blood cultures positive for gram-negative diplococci.  History of cat bite on the left anterior shin few days ago. Patient also has an history of mechanical heart valve. ID consulted for concern of Pasteurella bacteremia. -Continue cefepime for now   Complete heart block [facility]) Patient with symptomatic bradycardia and repeat EKG this morning with concern of complete heart block.  Cardiology was consulted and a temporary percutaneous pacemaker was placed. INR remained supra therapeutic-he was given 10 mg of vitamin K and will need a pacemaker placement once INR improves. -Continue to monitor -Repeat INR in the afternoon -Appreciate cardiology help  Hypertension  Patient has relative hypotension due to sepsis Hold amlodipine, lisinopril and metoprolol   urinary retention Patient has a known history of BPH and was noted to have urinary retention upon arrival to the ER.  History of self-catheterization. Imaging showed markedly distended urinary bladder and a Foley catheter was placed in the ER -Continue Flomax and finasteride  Hypothyroidism Stable Continue Synthroid  H/O heart valve  replacement with mechanical valve Patient has a history of mechanical heart valve (Aortic) and is on Coumadin. INR supratherapeutic. Check daily PT/INR and management of Coumadin per pharmacy. Received 1 dose of IV vitamin K to reverse for cardiac procedure   Subjective: Patient is feeling lethargic and weak when seen this morning.  Denies any dizziness.  Denies any urinary symptoms.  Having some pain in the left lower leg around cat bite area.  Physical Exam: Vitals:   06/06/22 1200 06/06/22 1230 06/06/22 1300 06/06/22 1330  BP: (!) 122/52 (!) 103/50 (!) 122/43 (!) 114/57  Pulse: (!) 40 (!) 41 (!) 44 (!) 45  Resp: 16 16 (!) 25 (!) 24  Temp:      TempSrc:      SpO2: 98% 99% 95% 94%  Weight:      Height:       General.  Ill-appearing, lethargic elderly man, in no acute distress. Pulmonary.  Lungs clear bilaterally, normal respiratory effort. CV.  Irregular, bradycardia Abdomen.  Soft, nontender, nondistended, BS positive. CNS.  Alert and oriented .  No focal neurologic deficit. Extremities.  No edema, no cyanosis, pulses intact and symmetrical. Psychiatry.  Judgment and insight appears normal.  Data Reviewed: Prior data reviewed  Family Communication: Discussed with son on phone  Disposition: Status is: Inpatient Remains inpatient appropriate because: Severity of illness   Planned Discharge Destination: Home  DVT prophylaxis.  Coumadin Time spent: 52 minutes  This record has been created using Systems analyst. Errors have been sought and corrected,but may not always be located. Such creation errors do not reflect on the standard of care.  Author: Lorella Nimrod, MD 06/06/2022 1:47 PM  For on call review [email or web address].

## 2022-06-06 NOTE — Hospital Course (Addendum)
Taken from H&P.  Dean Cruz is a 88 y.o. male with medical history significant for chronic diastolic dysfunction CHF, hypertension, BPH with history of urinary retention and self-catheterization twice daily 6 at home who was brought into the ER by EMS for evaluation of generalized weakness. Patient states that he woke up on the morning of his admission and was trying to put his socks on when he slid out off his chair landing on the floor.  He was too weak to get up and so he sat on the floor for about an hour until his granddaughter came home and found him.  She called EMS and he was found to be febrile with a Tmax of 101.5 and hypotensive with a blood pressure of 97/50. He states that he was nauseous in route to the hospital and had an episode of emesis in the ambulance.  His oral intake has been poor over the last several days and he complains of feeling very weak but denies any dizziness or lightheadedness. He denies having any chest pain, no shortness of breath, no headache, no changes in his bowel habits, no leg swelling, no blurred vision or focal deficit. He had a recent cat bite involving his left leg and has some redness over the left anterior leg.   Per EMS patient had room air pulse oximetry of 90% and was placed on 2 L of oxygen.  Patient met sepsis criteria with fever and tachypnea.  Mild lactic acidosis at 2.1, UA with significant pyuria, bacteriuria and hematuria.  Blood cultures with gram-negative diplococci.  Creatinine of 1.5 with unknown baseline as his care was in [place]. Patient received cefepime and vancomycin in ED and was continued on cefepime.  7/26: Patient overnight developed symptomatic bradycardia requiring one-time dose of atropine.  This morning he was still feeling very lethargic, EKG was obtained due to heart rate being in high 80s to low 40s and found to have complete heart block.  Per patient his normal heart rate is normal in 60s to 70s.  Cardiology was consulted and  a temporary percutaneous pacemaker was placed.  He was also given vitamin K for supratherapeutic INR and will need a pacemaker once INR normalizes. ID was also consulted for concern of Pasteurella bacteremia.  Patient's son wants him to be transferred to [place]. will not be safe at this time but we will contact them to see if that is a possibility.  7/27: Patient remained stable, continued to feel very weak.  Temporary pacemaker in place and a permanent pacemaker will be placed on Monday.  Will be going for TEE tomorrow to rule out endocarditis.  Blood cultures with Pasteurella multocida, pending susceptibility. Antibiotics switched to Zosyn by ID.  7/28: TEE was negative for endocarditis.  Temporary pacemaker will be placed on Monday.  Repeat blood cultures ordered today.  Urine cultures with Staph epidermidis.

## 2022-06-06 NOTE — Progress Notes (Signed)
Admission profile updated. ?

## 2022-06-06 NOTE — Progress Notes (Signed)
       CROSS COVER NOTE  NAME: Dean Cruz MRN: [id number] DOB : 07-13-1927    Date of Service   06/06/22  HPI/Events of Note   [name] is reporting 5/10 midsternal pulsating chest pain first reported after TVP did not capture. RN reached out to Cardiology who recommended increasing output. I was contacted by RN to address report of chest pain.  [name] denies nausea, vomiting, abdominal pain, radiation of pain, dyspnea, fatigue, diaphoresis, dizziness, or palpitations. The pain is not reproducible on exam.  Chest pain resolved with SL nitro x1.  Interventions   Plan: SL Nitroglycerin EKG Chest xray Troponin downtrending 297 yesterday morning 213-->203 tonight       This document was prepared using Dragon voice recognition software and may include unintentional dictation errors.  Bishop Limbo DNP, MHA, FNP-BC Nurse Practitioner [facility] Pager [phone]

## 2022-06-07 ENCOUNTER — Encounter: Payer: Self-pay | Admitting: Cardiology

## 2022-06-07 DIAGNOSIS — A419 Sepsis, unspecified organism: Secondary | ICD-10-CM | POA: Diagnosis not present

## 2022-06-07 DIAGNOSIS — R7881 Bacteremia: Secondary | ICD-10-CM

## 2022-06-07 DIAGNOSIS — L03116 Cellulitis of left lower limb: Secondary | ICD-10-CM

## 2022-06-07 DIAGNOSIS — I442 Atrioventricular block, complete: Secondary | ICD-10-CM | POA: Diagnosis not present

## 2022-06-07 DIAGNOSIS — W5501XA Bitten by cat, initial encounter: Secondary | ICD-10-CM | POA: Diagnosis not present

## 2022-06-07 DIAGNOSIS — N390 Urinary tract infection, site not specified: Secondary | ICD-10-CM | POA: Diagnosis not present

## 2022-06-07 LAB — PROTIME-INR
INR: 1.3 — ABNORMAL HIGH (ref 0.8–1.2)
Prothrombin Time: 16.1 seconds — ABNORMAL HIGH (ref 11.4–15.2)

## 2022-06-07 LAB — TROPONIN I (HIGH SENSITIVITY)
Troponin I (High Sensitivity): 203 ng/L (ref <18)
Troponin I (High Sensitivity): 213 ng/L (ref <18)

## 2022-06-07 LAB — HEPARIN LEVEL (UNFRACTIONATED): Heparin Unfractionated: 0.12 IU/mL — ABNORMAL LOW (ref 0.30–0.70)

## 2022-06-07 MED ORDER — HEPARIN (PORCINE) 25000 UT/250ML-% IV SOLN
1600.0000 [IU]/h | INTRAVENOUS | Status: AC
  Administered 2022-06-07: 1100 [IU]/h via INTRAVENOUS
  Administered 2022-06-08: 1600 [IU]/h via INTRAVENOUS
  Administered 2022-06-08: 1350 [IU]/h via INTRAVENOUS
  Administered 2022-06-09 – 2022-06-12 (×5): 1600 [IU]/h via INTRAVENOUS
  Filled 2022-06-07 (×8): qty 250

## 2022-06-07 MED ORDER — SODIUM CHLORIDE 0.9 % IV SOLN
3.0000 g | Freq: Four times a day (QID) | INTRAVENOUS | Status: DC
  Administered 2022-06-07 – 2022-06-13 (×22): 3 g via INTRAVENOUS
  Filled 2022-06-07 (×2): qty 8
  Filled 2022-06-07 (×2): qty 3
  Filled 2022-06-07 (×2): qty 8
  Filled 2022-06-07: qty 3
  Filled 2022-06-07: qty 8
  Filled 2022-06-07 (×3): qty 3
  Filled 2022-06-07: qty 8
  Filled 2022-06-07 (×6): qty 3
  Filled 2022-06-07: qty 8
  Filled 2022-06-07 (×2): qty 3
  Filled 2022-06-07: qty 8
  Filled 2022-06-07 (×2): qty 3

## 2022-06-07 MED ORDER — HEPARIN BOLUS VIA INFUSION
4000.0000 [IU] | Freq: Once | INTRAVENOUS | Status: AC
Start: 2022-06-07 — End: 2022-06-07
  Administered 2022-06-07: 4000 [IU] via INTRAVENOUS
  Filled 2022-06-07: qty 4000

## 2022-06-07 MED ORDER — CALCIUM CARBONATE ANTACID 500 MG PO CHEW
1.0000 | CHEWABLE_TABLET | ORAL | Status: DC | PRN
  Administered 2022-06-07: 400 mg via ORAL
  Administered 2022-06-08: 200 mg via ORAL
  Filled 2022-06-07: qty 1
  Filled 2022-06-07 (×2): qty 2

## 2022-06-07 MED ORDER — HEPARIN BOLUS VIA INFUSION
2400.0000 [IU] | Freq: Once | INTRAVENOUS | Status: AC
  Administered 2022-06-08: 2400 [IU] via INTRAVENOUS
  Filled 2022-06-07: qty 2400

## 2022-06-07 MED ORDER — ENOXAPARIN SODIUM 80 MG/0.8ML IJ SOSY: 1.0000 mg/kg | PREFILLED_SYRINGE | Freq: Every day | INTRAMUSCULAR | Status: DC

## 2022-06-07 NOTE — Progress Notes (Signed)
Progress Note   Patient: Dean Cruz ZOX:[id number] DOB: October 29, 1927 DOA: 06/05/2022     2 DOS: the patient was seen and examined on 06/07/2022   Brief hospital course: Taken from H&P.  Carlester Kasparek is a 88 y.o. male with medical history significant for chronic diastolic dysfunction CHF, hypertension, BPH with history of urinary retention and self-catheterization twice daily 6 at home who was brought into the ER by EMS for evaluation of generalized weakness. Patient states that he woke up on the morning of his admission and was trying to put his socks on when he slid out off his chair landing on the floor.  He was too weak to get up and so he sat on the floor for about an hour until his granddaughter came home and found him.  She called EMS and he was found to be febrile with a Tmax of 101.5 and hypotensive with a blood pressure of 97/50. He states that he was nauseous in route to the hospital and had an episode of emesis in the ambulance.  His oral intake has been poor over the last several days and he complains of feeling very weak but denies any dizziness or lightheadedness. He denies having any chest pain, no shortness of breath, no headache, no changes in his bowel habits, no leg swelling, no blurred vision or focal deficit. He had a recent cat bite involving his left leg and has some redness over the left anterior leg.   Per EMS patient had room air pulse oximetry of 90% and was placed on 2 L of oxygen.  Patient met sepsis criteria with fever and tachypnea.  Mild lactic acidosis at 2.1, UA with significant pyuria, bacteriuria and hematuria.  Blood cultures with gram-negative diplococci.  Creatinine of 1.5 with unknown baseline as his care was in [place]. Patient received cefepime and vancomycin in ED and was continued on cefepime.  7/26: Patient overnight developed symptomatic bradycardia requiring one-time dose of atropine.  This morning he was still feeling very lethargic, EKG was  obtained due to heart rate being in high 80s to low 40s and found to have complete heart block.  Per patient his normal heart rate is normal in 60s to 70s.  Cardiology was consulted and a temporary percutaneous pacemaker was placed.  He was also given vitamin K for supratherapeutic INR and will need a pacemaker once INR normalizes. ID was also consulted for concern of Pasteurella bacteremia.  Patient's son wants him to be transferred to [place]. will not be safe at this time but we will contact them to see if that is a possibility.  7/27: Patient remained stable, continued to feel very weak.  Temporary pacemaker in place and a permanent pacemaker will be placed on Monday.  Will be going for TEE tomorrow to rule out endocarditis.  Blood cultures with Pasteurella multocida, pending susceptibility. Antibiotics switched to Zosyn by ID.   Assessment and Plan: * Sepsis  As evidenced by fever with a Tmax of 101.6, relative hypotension with systolic blood pressure in the 90s, lactic acidosis and pyuria. Patient has a history of BPH with urinary retention and does self-catheterization twice a day.  No urinary symptoms. Blood cultures positive for Pasteurella, pending susceptibility.   History of cat bite on the left anterior shin few days ago. Patient also has an history of mechanical heart valve. ID consulted for concern of Pasteurella bacteremia.  Antibiotics were switched to Zosyn. TEE tomorrow to rule out endocarditis -Continue Zosyn   Complete heart  block ([place]) Heart rate improved with temporary pacemaker in place. Permanent pacemaker to be placed on Monday. Continue to feel weak. -Appreciate cardiology recommendations. -Continue to monitor -Heparin infusion until the procedure for mechanical heart valve  Hypertension Blood pressure variable, currently within goal Continue holding amlodipine, lisinopril and metoprolol   urinary retention Patient has a known history of BPH and was noted to  have urinary retention upon arrival to the ER.  History of self-catheterization. Imaging showed markedly distended urinary bladder and a Foley catheter was placed in the ER -Continue Flomax and finasteride  Hypothyroidism Stable Continue Synthroid  H/O heart valve replacement with mechanical valve Patient has a history of mechanical heart valve (Aortic) and is on Coumadin.Marland Kitchen Received 1 dose of IV vitamin K to reverse for cardiac procedure INR at 1.3 today -Heparin infusion until permanent pacemaker placed on Monday   Subjective: Patient continued to feel weak.  No other complaints.  Son at bedside  Physical Exam: Vitals:   06/07/22 0800 06/07/22 0900 06/07/22 1023 06/07/22 1200  BP: (!) 141/61 (!) 169/47 (!) 109/59 129/64  Pulse: 69 69 70 70  Resp: $Remo'15 19 19 17  'JdDTW$ Temp:    99.2 F (37.3 C)  TempSrc:    Oral  SpO2: 98% 97% 98% 96%  Weight:      Height:       General.  Ill-appearing elderly man, in no acute distress. Pulmonary.  Lungs clear bilaterally, normal respiratory effort. CV.  Regular rate and rhythm, no JVD, rub or murmur. Abdomen.  Soft, nontender, nondistended, BS positive. CNS.  Alert and oriented .  No focal neurologic deficit. Extremities.  No edema, no cyanosis, pulses intact and symmetrical. Psychiatry.  Judgment and insight appears normal.  Data Reviewed: Prior notes and data reviewed  Family Communication: Discussed with son at bedside  Disposition: Status is: Inpatient Remains inpatient appropriate because: Severity of illness   Planned Discharge Destination: Home  Time spent: 50 minutes  This record has been created using Systems analyst. Errors have been sought and corrected,but may not always be located. Such creation errors do not reflect on the standard of care.  Author: Lorella Nimrod, MD 06/07/2022 1:22 PM  For on call review [email or web address].

## 2022-06-07 NOTE — Consult Note (Addendum)
ANTICOAGULATION CONSULT NOTE  Pharmacy Consult for IV Heparin Indication:  Peri-procedural bridging for history of mechanical mitral valve on warfarin  Patient Measurements: Height: 5\' 6"  (167.6 cm) Weight: 78.5 kg (173 lb 1 oz) IBW/kg (Calculated) : 63.8 Heparin Dosing Weight: 78.5 kg  Labs: Recent Labs    06/05/22 1051 06/05/22 1051 06/05/22 2027 06/05/22 2207 06/06/22 0412 06/06/22 0600 06/06/22 1603 06/06/22 2351 06/07/22 0126 06/07/22 2157  HGB 10.4*  --   --   --  9.2*  --   --   --   --   --   HCT 31.9*  --   --   --  28.9*  --   --   --   --   --   PLT 170  --   --   --  146*  --   --   --   --   --   LABPROT  --    < > 37.0*  --  37.0*  --  21.3*  --  16.1*  --   INR  --    < > 3.8*  --  3.8*  --  1.9*  --  1.3*  --   HEPARINUNFRC  --   --   --   --   --   --   --   --   --  0.12*  CREATININE 1.50*  --   --   --  1.54*  --   --   --   --   --   CKTOTAL  --   --  1,513*  --   --   --   --   --   --   --   TROPONINIHS 100*  --  222*   < > 266* 297*  --  213* 203*  --    < > = values in this interval not displayed.     Estimated Creatinine Clearance: 29.5 mL/min (A) (by C-G formula based on SCr of 1.54 mg/dL (H)).   Medications:  Warfarin 1.25 mg on Tu and 2.5 mg every other day (TWD 16.25 mg)   Assessment: [name] with medical history including diastolic CHF, HTN, BPH, urinary retention / self catheterization, mechanical mitral valve replacement who is admitted with sepsis secondary to presumed UTI complicated by bacteremia and complete heart block s/p temporary pacemaker insertion on 7/26. Warfarin was reversed prior to procedure and is now subtherapeutic. Further instructions to hold warfarin pending anticipated permanent pacemaker placement on 7/31. Patient will also likely need to undergo TEE to rule out endocarditis. Plan is to keep patient on heparin for now. Pharmacy consulted to initiate and manage IV heparin for history of mechanical mitral  valve.  Goal of Therapy:  Heparin level 0.3-0.7 units/ml INR 2.5 - 3.5 (when warfarin is resumed) Monitor platelets by anticoagulation protocol: Yes  7/27 2157 HL 0.12, subtherapeutic   Plan:  --Heparin 2400 unit IV bolus --Increase heparin infusion to 1350 units/hr --Recheck HL 8 hours after rate change --Daily CBC per protocol while on IV heparin  2158, PharmD, [facility] 06/07/2022 11:46 PM

## 2022-06-07 NOTE — Assessment & Plan Note (Signed)
Patient has a history of mechanical heart valve (Aortic) and is on Coumadin.Dean Cruz Received 1 dose of IV vitamin K to reverse for cardiac procedure INR at 1.3 today -Heparin infusion until permanent pacemaker placed on Monday

## 2022-06-07 NOTE — Assessment & Plan Note (Signed)
As evidenced by fever with a Tmax of 101.6, relative hypotension with systolic blood pressure in the 90s, lactic acidosis and pyuria. Patient has a history of BPH with urinary retention and does self-catheterization twice a day.  No urinary symptoms. Blood cultures positive for Pasteurella, pending susceptibility.   History of cat bite on the left anterior shin few days ago. Patient also has an history of mechanical heart valve. ID consulted for concern of Pasteurella bacteremia.  Antibiotics were switched to Zosyn. TEE tomorrow to rule out endocarditis -Continue Zosyn

## 2022-06-07 NOTE — Assessment & Plan Note (Signed)
Patient has a known history of BPH and was noted to have urinary retention upon arrival to the ER.  History of self-catheterization. Imaging showed markedly distended urinary bladder and a Foley catheter was placed in the ER -Continue Flomax and finasteride

## 2022-06-07 NOTE — Assessment & Plan Note (Signed)
Heart rate improved with temporary pacemaker in place. Permanent pacemaker to be placed on Monday. Continue to feel weak. -Appreciate cardiology recommendations. -Continue to monitor -Heparin infusion until the procedure for mechanical heart valve 

## 2022-06-07 NOTE — Assessment & Plan Note (Signed)
Blood pressure variable, currently within goal Continue holding amlodipine, lisinopril and metoprolol

## 2022-06-07 NOTE — Significant Event (Addendum)
Called to the bedside by nursing staff this morning as temporary pacemaker was intermittently not capturing.  Patient is asymptomatic from this, sitting upright eating breakfast.  Dr. Darrold Junker and I interrogated the device and his underlying rhythm is still complete heart block.  The pacer wire was repositioned by advancing it 2.5 cm (now at 37.5 cm).  The threshold / output was improved to 0.2 mA and was set back to 5 mA and the pacer wire in the right IJ was secured with gauze and Tegaderm dressing.  Discussed with the patient and his son at bedside plan for tentative plan for permanent pacemaker placement with a Micra leadless pacemaker as early as Monday 7/31.  Recommend continuing to hold warfarin and bridge with heparin drip until then.  Rebeca Allegra, PA-C  [facility]

## 2022-06-07 NOTE — Consult Note (Signed)
ANTICOAGULATION CONSULT NOTE  Pharmacy Consult for IV Heparin Indication:  Peri-procedural bridging for history of mechanical mitral valve on warfarin  Patient Measurements: Height: 5\' 6"  (167.6 cm) Weight: 78.5 kg (173 lb 1 oz) IBW/kg (Calculated) : 63.8 Heparin Dosing Weight: 78.5 kg  Labs: Recent Labs    06/05/22 1051 06/05/22 1051 06/05/22 2027 06/05/22 2207 06/06/22 0412 06/06/22 0600 06/06/22 1603 06/06/22 2351 06/07/22 0126  HGB 10.4*  --   --   --  9.2*  --   --   --   --   HCT 31.9*  --   --   --  28.9*  --   --   --   --   PLT 170  --   --   --  146*  --   --   --   --   LABPROT  --    < > 37.0*  --  37.0*  --  21.3*  --  16.1*  INR  --    < > 3.8*  --  3.8*  --  1.9*  --  1.3*  CREATININE 1.50*  --   --   --  1.54*  --   --   --   --   CKTOTAL  --   --  1,513*  --   --   --   --   --   --   TROPONINIHS 100*  --  222*   < > 266* 297*  --  213* 203*   < > = values in this interval not displayed.    Estimated Creatinine Clearance: 29.5 mL/min (A) (by C-G formula based on SCr of 1.54 mg/dL (H)).   Medications:  Warfarin 1.25 mg on Tu and 2.5 mg every other day (TWD 16.25 mg)   Assessment: [name] with medical history including diastolic CHF, HTN, BPH, urinary retention / self catheterization, mechanical mitral valve replacement who is admitted with sepsis secondary to presumed UTI complicated by bacteremia and complete heart block s/p temporary pacemaker insertion on 7/26. Warfarin was reversed prior to procedure and is now subtherapeutic. Further instructions to hold warfarin pending anticipated permanent pacemaker placement on 7/31. Patient will also likely need to undergo TEE to rule out endocarditis. Plan is to keep patient on heparin for now. Pharmacy consulted to initiate and manage IV heparin for history of mechanical mitral valve.  Goal of Therapy:  Heparin level 0.3-0.7 units/ml INR 2.5 - 3.5 (when warfarin is resumed) Monitor platelets by  anticoagulation protocol: Yes   Plan:  --Heparin 4000 unit IV bolus followed by continuous infusion at 1100 units/hr --HL 8 hours after initiation of infusion --Daily CBC per protocol while on IV heparin  8/31 06/07/2022,10:37 AM

## 2022-06-07 NOTE — Progress Notes (Signed)
ID Pt is feeling weak He is depressed BP 111/67   Pulse 70   Temp 98.3 F (36.8 C) (Oral)   Resp 18   Ht 5\' 6"  (1.676 m)   Wt 78.5 kg   SpO2 99%   BMI 27.93 kg/m   O/e awake and alert Chest b/l air entry Hss1s2- fluctating rate Paced Abd soft Cns non focal Left leg erythema better   Labs Micro-pasteurella bacteremia     Latest Ref Rng & Units 06/06/2022    4:12 AM 06/05/2022   10:51 AM 11/26/2017    3:10 AM  CBC  WBC 4.0 - 10.5 K/uL 7.4  9.5  7.9   Hemoglobin 13.0 - 17.0 g/dL 9.2  11/28/2017  31.5   Hematocrit 39.0 - 52.0 % 28.9  31.9  34.2   Platelets 150 - 400 K/uL 146  170  196        Latest Ref Rng & Units 06/06/2022    4:12 AM 06/05/2022   10:51 AM 11/26/2017    3:10 AM  CMP  Glucose 70 - 99 mg/dL 98  11/28/2017  867   BUN 8 - 23 mg/dL 29  32  32   Creatinine 0.61 - 1.24 mg/dL 619  5.09  3.26   Sodium 135 - 145 mmol/L 138  138  138   Potassium 3.5 - 5.1 mmol/L 3.4  3.9  4.2   Chloride 98 - 111 mmol/L 108  108  108   CO2 22 - 32 mmol/L 23  21  24    Calcium 8.9 - 10.3 mg/dL 8.2  8.7  8.4   Total Protein 6.5 - 8.1 g/dL  6.9    Total Bilirubin 0.3 - 1.2 mg/dL  1.1    Alkaline Phos 38 - 126 U/L  66    AST 15 - 41 U/L  21    ALT 0 - 44 U/L  13        Impression/recommendation 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

## 2022-06-07 NOTE — Progress Notes (Addendum)
0830 patient pacer not capturing Dr Welton Flakes at bedside increased milli amp to 6 per Dr Welton Flakes 0900 paged cardiology regarding non captures that are continuing milliamp up to 8 1000 cardiology advanced leads at bedside capturing well  1130 blood cultures came back with Pasteurella Multcida spoke with patient regarding the infection and that his cat needs PCN treatment patient asked that his son Maurine Minister be called to get medication from [facility]. Maurine Minister called and updated. Maurine Minister wants to give cat cipro that he has left over because he gives it to his dog at times and it helps the dog? Maurine Minister informed that [facility] needs to give medication and dosages to cat based on Pasteurella Multcida that patient has. Patient doesn't need to get reinfected after having a pacer placed. 1200 patient having V3 V4 and V4 V5 elevation alarms 12 lead completed while ekg was alarming cardiology paged, cards not concerned with EKGs

## 2022-06-07 NOTE — Progress Notes (Signed)
SUBJECTIVE: Had chest pain last night and had few episodes of pacemaker not capturing   Vitals:   06/07/22 0700 06/07/22 0728 06/07/22 0800 06/07/22 0900  BP: 130/65  (!) 141/61 (!) 169/47  Pulse: 70  69 69  Resp: 14  15 19   Temp:  98.1 F (36.7 C)    TempSrc:  Oral    SpO2: 96%  98% 97%  Weight:      Height:        Intake/Output Summary (Last 24 hours) at 06/07/2022 0919 Last data filed at 06/07/2022 0900 Gross per 24 hour  Intake 424.06 ml  Output 925 ml  Net -500.94 ml    LABS: Basic Metabolic Panel: Recent Labs    06/05/22 1051 06/06/22 0412  NA 138 138  K 3.9 3.4*  CL 108 108  CO2 21* 23  GLUCOSE 138* 98  BUN 32* 29*  CREATININE 1.50* 1.54*  CALCIUM 8.7* 8.2*   Liver Function Tests: Recent Labs    06/05/22 1051  AST 21  ALT 13  ALKPHOS 66  BILITOT 1.1  PROT 6.9  ALBUMIN 3.7   No results for input(s): "LIPASE", "AMYLASE" in the last 72 hours. CBC: Recent Labs    06/05/22 1051 06/06/22 0412  WBC 9.5 7.4  NEUTROABS 8.9*  --   HGB 10.4* 9.2*  HCT 31.9* 28.9*  MCV 88.9 91.7  PLT 170 146*   Cardiac Enzymes: Recent Labs    06/05/22 2027  CKTOTAL 1,513*   BNP: Invalid input(s): "POCBNP" D-Dimer: No results for input(s): "DDIMER" in the last 72 hours. Hemoglobin A1C: No results for input(s): "HGBA1C" in the last 72 hours. Fasting Lipid Panel: No results for input(s): "CHOL", "HDL", "LDLCALC", "TRIG", "CHOLHDL", "LDLDIRECT" in the last 72 hours. Thyroid Function Tests: No results for input(s): "TSH", "T4TOTAL", "T3FREE", "THYROIDAB" in the last 72 hours.  Invalid input(s): "FREET3" Anemia Panel: No results for input(s): "VITAMINB12", "FOLATE", "FERRITIN", "TIBC", "IRON", "RETICCTPCT" in the last 72 hours.   PHYSICAL EXAM General: Well developed, well nourished, in no acute distress HEENT:  Normocephalic and atramatic Neck:  No JVD.  Lungs: Clear bilaterally to auscultation and percussion. Heart: HRRR . Normal S1 and S2 without gallops  or murmurs.  Abdomen: Bowel sounds are positive, abdomen soft and non-tender  Msk:  Back normal, normal gait. Normal strength and tone for age. Extremities: No clubbing, cyanosis or edema.   Neuro: Alert and oriented X 3. Psych:  Good affect, responds appropriately  TELEMETRY: VVI 100% paced rhythm  ASSESSMENT AND PLAN: Complete heart block with temporary pacemaker via right IJ pacing 100%.  He was lack of capture at times as abs were increased to 6 and was requested by nurse to have Dr. 2028 evaluate may be needs some manipulation of the pacemaker.  Patient does not want to have TEE or cardiac catheterization at this hospital.  [facility] where he was being treated before has no beds and is on divert.  He may need to be transferred to [facility] because if he has sepsis he cannot get permanent pacemaker implanted here.  Principal Problem:   Sepsis  Active Problems:   Hypertension    urinary retention   Hypothyroidism   H/O heart valve replacement with mechanical valve   Complete heart block (HCC)    [facility], MD, [facility] 06/07/2022 9:19 AM

## 2022-06-08 ENCOUNTER — Encounter
Admission: EM | Disposition: A | Discharge status: Home / Self Care | Payer: Self-pay | Source: Home / Self Care | Attending: Internal Medicine

## 2022-06-08 ENCOUNTER — Inpatient Hospital Stay
Admit: 2022-06-08 | Discharge: 2022-06-08 | Disposition: A | Discharge status: Home / Self Care | Payer: No Typology Code available for payment source | Attending: Cardiovascular Disease | Admitting: Cardiovascular Disease

## 2022-06-08 DIAGNOSIS — R7881 Bacteremia: Secondary | ICD-10-CM | POA: Diagnosis not present

## 2022-06-08 DIAGNOSIS — Z950 Presence of cardiac pacemaker: Secondary | ICD-10-CM

## 2022-06-08 DIAGNOSIS — L03116 Cellulitis of left lower limb: Secondary | ICD-10-CM | POA: Diagnosis not present

## 2022-06-08 DIAGNOSIS — I442 Atrioventricular block, complete: Secondary | ICD-10-CM | POA: Diagnosis not present

## 2022-06-08 DIAGNOSIS — A280 Pasteurellosis: Secondary | ICD-10-CM | POA: Diagnosis not present

## 2022-06-08 DIAGNOSIS — R339 Retention of urine, unspecified: Secondary | ICD-10-CM

## 2022-06-08 DIAGNOSIS — W5501XA Bitten by cat, initial encounter: Secondary | ICD-10-CM | POA: Diagnosis not present

## 2022-06-08 HISTORY — PX: TEE WITHOUT CARDIOVERSION: SHX5443

## 2022-06-08 LAB — CBC
HCT: 32.6 % — ABNORMAL LOW (ref 39.0–52.0)
Hemoglobin: 10.7 g/dL — ABNORMAL LOW (ref 13.0–17.0)
MCH: 28.5 pg (ref 26.0–34.0)
MCHC: 32.8 g/dL (ref 30.0–36.0)
MCV: 86.7 fL (ref 80.0–100.0)
Platelets: 137 10*3/uL — ABNORMAL LOW (ref 150–400)
RBC: 3.76 MIL/uL — ABNORMAL LOW (ref 4.22–5.81)
RDW: 14.2 % (ref 11.5–15.5)
WBC: 6.6 10*3/uL (ref 4.0–10.5)
nRBC: 0 % (ref 0.0–0.2)

## 2022-06-08 LAB — CREATININE, SERUM
Creatinine, Ser: 1 mg/dL (ref 0.61–1.24)
GFR, Estimated: >60 mL/min (ref >60)

## 2022-06-08 LAB — PROTIME-INR
INR: 1 (ref 0.8–1.2)
Prothrombin Time: 13.4 seconds (ref 11.4–15.2)

## 2022-06-08 LAB — URINE CULTURE: Culture: 3000 — AB

## 2022-06-08 LAB — BASIC METABOLIC PANEL
Anion gap: 6 (ref 5–15)
BUN: 23 mg/dL (ref 8–23)
CO2: 23 mmol/L (ref 22–32)
Calcium: 8 mg/dL — ABNORMAL LOW (ref 8.9–10.3)
Chloride: 109 mmol/L (ref 98–111)
Creatinine, Ser: 1.03 mg/dL (ref 0.61–1.24)
GFR, Estimated: >60 mL/min (ref >60)
Glucose, Bld: 102 mg/dL — ABNORMAL HIGH (ref 70–99)
Potassium: 3.5 mmol/L (ref 3.5–5.1)
Sodium: 138 mmol/L (ref 135–145)

## 2022-06-08 LAB — HEPARIN LEVEL (UNFRACTIONATED)
Heparin Unfractionated: 0.16 IU/mL — ABNORMAL LOW (ref 0.30–0.70)
Heparin Unfractionated: 0.37 IU/mL (ref 0.30–0.70)

## 2022-06-08 LAB — CULTURE, BLOOD (ROUTINE X 2)

## 2022-06-08 SURGERY — ECHOCARDIOGRAM, TRANSESOPHAGEAL
Anesthesia: Moderate Sedation | Laterality: Right

## 2022-06-08 MED ORDER — LIDOCAINE VISCOUS HCL 2 % MT SOLN
OROMUCOSAL | Status: AC
  Filled 2022-06-08: qty 15

## 2022-06-08 MED ORDER — MIDAZOLAM HCL 2 MG/2ML IJ SOLN
INTRAMUSCULAR | Status: AC | PRN
  Administered 2022-06-08: 1 mg via INTRAVENOUS

## 2022-06-08 MED ORDER — SODIUM CHLORIDE 0.9 % IV SOLN: INTRAVENOUS | Status: DC

## 2022-06-08 MED ORDER — FENTANYL CITRATE (PF) 100 MCG/2ML IJ SOLN
INTRAMUSCULAR | Status: AC
  Filled 2022-06-08: qty 4

## 2022-06-08 MED ORDER — HEPARIN BOLUS VIA INFUSION
2400.0000 [IU] | Freq: Once | INTRAVENOUS | Status: AC
  Administered 2022-06-08: 2400 [IU] via INTRAVENOUS
  Filled 2022-06-08: qty 2400

## 2022-06-08 MED ORDER — MIDAZOLAM HCL 2 MG/2ML IJ SOLN
INTRAMUSCULAR | Status: AC
  Filled 2022-06-08: qty 4

## 2022-06-08 MED ORDER — BUTAMBEN-TETRACAINE-BENZOCAINE 2-2-14 % EX AERO
INHALATION_SPRAY | CUTANEOUS | Status: AC
  Filled 2022-06-08: qty 5

## 2022-06-08 MED ORDER — SODIUM CHLORIDE FLUSH 0.9 % IV SOLN
INTRAVENOUS | Status: AC
  Administered 2022-06-08: 10 mL
  Filled 2022-06-08: qty 10

## 2022-06-08 MED ORDER — FENTANYL CITRATE (PF) 100 MCG/2ML IJ SOLN
INTRAMUSCULAR | Status: AC | PRN
  Administered 2022-06-08: 25 ug via INTRAVENOUS

## 2022-06-08 NOTE — Consult Note (Signed)
ANTICOAGULATION CONSULT NOTE  Pharmacy Consult for IV Heparin Indication:  Peri-procedural bridging for history of mechanical mitral valve on warfarin  Patient Measurements: Height: 5\' 6"  (167.6 cm) Weight: 78.5 kg (173 lb 1 oz) IBW/kg (Calculated) : 63.8 Heparin Dosing Weight: 78.5 kg  Labs: Recent Labs    06/06/22 0412 06/06/22 0600 06/06/22 1603 06/06/22 2351 06/07/22 0126 06/07/22 2157 06/08/22 0732 06/08/22 2037  HGB 9.2*  --   --   --   --   --  10.7*  --   HCT 28.9*  --   --   --   --   --  32.6*  --   PLT 146*  --   --   --   --   --  137*  --   LABPROT 37.0*  --  21.3*  --  16.1*  --  13.4  --   INR 3.8*  --  1.9*  --  1.3*  --  1.0  --   HEPARINUNFRC  --   --   --   --   --  0.12* 0.16* 0.37  CREATININE 1.54*  --   --   --   --   --  1.03  1.00  --   TROPONINIHS 266* 297*  --  213* 203*  --   --   --      Estimated Creatinine Clearance: 45.5 mL/min (by C-G formula based on SCr of 1 mg/dL).   Medications:  Warfarin 1.25 mg on Tu and 2.5 mg every other day (TWD 16.25 mg)   Assessment: [name] with medical history including diastolic CHF, HTN, BPH, urinary retention / self catheterization, mechanical mitral valve replacement who is admitted with sepsis secondary to presumed UTI complicated by bacteremia and complete heart block s/p temporary pacemaker insertion on 7/26. Warfarin was reversed prior to procedure and is now subtherapeutic. Further instructions to hold warfarin pending anticipated permanent pacemaker placement on 7/31. Patient will also likely need to undergo TEE to rule out endocarditis. Plan is to keep patient on heparin for now. Pharmacy consulted to initiate and manage IV heparin for history of mechanical mitral valve.  Goal of Therapy:  Heparin level 0.3-0.7 units/ml INR 2.5 - 3.5 (when warfarin is resumed) Monitor platelets by anticoagulation protocol: Yes  7/27 2157 HL 0.12; subtherapeutic, 1100 un/hr 7/28 0732 HL 0.16;  subtherapeutic, 1350 un/hr 7/28 2037 HL 0.37, therapeutic x 1   Plan:  --Heparin level is therapeutic x 1 --Continue Heparin infusion at 1600 units/hr --Recheck HL in 8 hours for confirmation --Daily CBC per protocol while on IV heparin  2038, PharmD, BCPS 06/08/2022 9:48 PM

## 2022-06-08 NOTE — Progress Notes (Signed)
Progress Note   Patient: Dean Cruz WLN:[id number] DOB: 06-11-1927 DOA: 06/05/2022     3 DOS: the patient was seen and examined on 06/08/2022   Brief hospital course: Taken from H&P.  Dean Cruz is a 88 y.o. male with medical history significant for chronic diastolic dysfunction CHF, hypertension, BPH with history of urinary retention and self-catheterization twice daily 6 at home who was brought into the ER by EMS for evaluation of generalized weakness. Patient states that he woke up on the morning of his admission and was trying to put his socks on when he slid out off his chair landing on the floor.  He was too weak to get up and so he sat on the floor for about an hour until his granddaughter came home and found him.  She called EMS and he was found to be febrile with a Tmax of 101.5 and hypotensive with a blood pressure of 97/50. He states that he was nauseous in route to the hospital and had an episode of emesis in the ambulance.  His oral intake has been poor over the last several days and he complains of feeling very weak but denies any dizziness or lightheadedness. He denies having any chest pain, no shortness of breath, no headache, no changes in his bowel habits, no leg swelling, no blurred vision or focal deficit. He had a recent cat bite involving his left leg and has some redness over the left anterior leg.   Per EMS patient had room air pulse oximetry of 90% and was placed on 2 L of oxygen.  Patient met sepsis criteria with fever and tachypnea.  Mild lactic acidosis at 2.1, UA with significant pyuria, bacteriuria and hematuria.  Blood cultures with gram-negative diplococci.  Creatinine of 1.5 with unknown baseline as his care was in [place]. Patient received cefepime and vancomycin in ED and was continued on cefepime.  7/26: Patient overnight developed symptomatic bradycardia requiring one-time dose of atropine.  This morning he was still feeling very lethargic, EKG was  obtained due to heart rate being in high 80s to low 40s and found to have complete heart block.  Per patient his normal heart rate is normal in 60s to 70s.  Cardiology was consulted and a temporary percutaneous pacemaker was placed.  He was also given vitamin K for supratherapeutic INR and will need a pacemaker once INR normalizes. ID was also consulted for concern of Pasteurella bacteremia.  Patient's son wants him to be transferred to [place]. will not be safe at this time but we will contact them to see if that is a possibility.  7/27: Patient remained stable, continued to feel very weak.  Temporary pacemaker in place and a permanent pacemaker will be placed on Monday.  Will be going for TEE tomorrow to rule out endocarditis.  Blood cultures with Pasteurella multocida, pending susceptibility. Antibiotics switched to Zosyn by ID.  7/28: TEE was negative for endocarditis.  Temporary pacemaker will be placed on Monday.  Repeat blood cultures ordered today.  Urine cultures with Staph epidermidis.   Assessment and Plan: * Sepsis  As evidenced by fever with a Tmax of 101.6, relative hypotension with systolic blood pressure in the 90s, lactic acidosis and pyuria. Patient has a history of BPH with urinary retention and does self-catheterization twice a day.  No urinary symptoms. Blood cultures positive for Pasteurella, pending susceptibility.   History of cat bite on the left anterior shin few days ago. Patient also has an history of mechanical  heart valve. ID consulted for concern of Pasteurella bacteremia.  Antibiotics were switched to Zosyn. TEE was negative for endocarditis -Repeat blood cultures -Continue Zosyn   Complete heart block (HCC) Heart rate improved with temporary pacemaker in place. Permanent pacemaker to be placed on Monday. Continue to feel weak. -Appreciate cardiology recommendations. -Continue to monitor -Heparin infusion until the procedure for mechanical heart  valve  Hypertension Blood pressure variable, currently within goal Continue holding amlodipine, lisinopril and metoprolol   urinary retention Patient has a known history of BPH and was noted to have urinary retention upon arrival to the ER.  History of self-catheterization. Imaging showed markedly distended urinary bladder and a Foley catheter was placed in the ER -Continue Flomax and finasteride  Hypothyroidism Stable Continue Synthroid  H/O heart valve replacement with mechanical valve Patient has a history of mechanical heart valve (Aortic) and is on Coumadin.Marland Kitchen Received 1 dose of IV vitamin K to reverse for cardiac procedure INR at 1.3 today -Heparin infusion until permanent pacemaker placed on Monday   Subjective: Patient is feeling little improved today.  Had his TEE earlier in the morning.  Son at bedside.  Denies any pain.  Physical Exam: Vitals:   06/08/22 1000 06/08/22 1015 06/08/22 1100 06/08/22 1200  BP: 115/67 134/76 125/76 126/71  Pulse: 70 69 69 70  Resp: 17 (!) _0 Temp:    98.4 F (36.9 C)  TempSrc:    Oral  SpO2: 99% 98% 99% 99%  Weight:      Height:       General.  Ill-appearing elderly man, in no acute distress. Pulmonary.  Lungs clear bilaterally, normal respiratory effort. CV.  Regular rate and rhythm, no JVD, rub or murmur. Abdomen.  Soft, nontender, nondistended, BS positive. CNS.  Alert and oriented .  No focal neurologic deficit. Extremities.  No edema, no cyanosis, pulses intact and symmetrical. Psychiatry.  Judgment and insight appears normal.  Data Reviewed: Prior data reviewed.  Family Communication: Discussed with son at bedside  Disposition: Status is: Inpatient Remains inpatient appropriate because: Severity of illness   Planned Discharge Destination: Home  DVT prophylaxis.  Heparin infusion Time spent: 50 minutes  This record has been created using Systems analyst. Errors have been sought and  corrected,but may not always be located. Such creation errors do not reflect on the standard of care.  Author: Lorella Nimrod, MD 06/08/2022 2:10 PM  For on call review [email or web address].

## 2022-06-08 NOTE — Consult Note (Signed)
ANTICOAGULATION CONSULT NOTE  Pharmacy Consult for IV Heparin Indication:  Peri-procedural bridging for history of mechanical mitral valve on warfarin  Patient Measurements: Height: 5\' 6"  (167.6 cm) Weight: 78.5 kg (173 lb 1 oz) IBW/kg (Calculated) : 63.8 Heparin Dosing Weight: 78.5 kg  Labs: Recent Labs    06/05/22 1051 06/05/22 1051 06/05/22 2027 06/05/22 2207 06/06/22 0412 06/06/22 0600 06/06/22 1603 06/06/22 2351 06/07/22 0126 06/07/22 2157  HGB 10.4*  --   --   --  9.2*  --   --   --   --   --   HCT 31.9*  --   --   --  28.9*  --   --   --   --   --   PLT 170  --   --   --  146*  --   --   --   --   --   LABPROT  --    < > 37.0*  --  37.0*  --  21.3*  --  16.1*  --   INR  --    < > 3.8*  --  3.8*  --  1.9*  --  1.3*  --   HEPARINUNFRC  --   --   --   --   --   --   --   --   --  0.12*  CREATININE 1.50*  --   --   --  1.54*  --   --   --   --   --   CKTOTAL  --   --  1,513*  --   --   --   --   --   --   --   TROPONINIHS 100*  --  222*   < > 266* 297*  --  213* 203*  --    < > = values in this interval not displayed.     Estimated Creatinine Clearance: 29.5 mL/min (A) (by C-G formula based on SCr of 1.54 mg/dL (H)).   Medications:  Warfarin 1.25 mg on Tu and 2.5 mg every other day (TWD 16.25 mg)   Assessment: [name] with medical history including diastolic CHF, HTN, BPH, urinary retention / self catheterization, mechanical mitral valve replacement who is admitted with sepsis secondary to presumed UTI complicated by bacteremia and complete heart block s/p temporary pacemaker insertion on 7/26. Warfarin was reversed prior to procedure and is now subtherapeutic. Further instructions to hold warfarin pending anticipated permanent pacemaker placement on 7/31. Patient will also likely need to undergo TEE to rule out endocarditis. Plan is to keep patient on heparin for now. Pharmacy consulted to initiate and manage IV heparin for history of mechanical mitral  valve.  Goal of Therapy:  Heparin level 0.3-0.7 units/ml INR 2.5 - 3.5 (when warfarin is resumed) Monitor platelets by anticoagulation protocol: Yes  7/27 2157 HL 0.12; subtherapeutic, 1100 un/hr 7/28 0732 HL 0.16; subtherapeutic, 1350 un/hr   Plan:  --Heparin level is subtherapeutic --Heparin 2400 unit IV bolus and increase infusion rate to 1600 units/hr --Recheck HL 8 hours after rate change --Daily CBC per protocol while on IV heparin  8/28  06/08/2022 8:17 AM

## 2022-06-08 NOTE — Progress Notes (Signed)
*  PRELIMINARY RESULTS* Echocardiogram 2D Echocardiogram has been performed.  Cristela Blue 06/08/2022, 9:54 AM

## 2022-06-08 NOTE — Assessment & Plan Note (Signed)
Heart rate improved with temporary pacemaker in place. Permanent pacemaker to be placed on Monday. Continue to feel weak. -Appreciate cardiology recommendations. -Continue to monitor -Heparin infusion until the procedure for mechanical heart valve 

## 2022-06-08 NOTE — TOC Initial Note (Signed)
Transition of Care [facility]) - Initial/Assessment Note    Patient Details  Name: Dean Cruz MRN: [id number] Date of Birth: [birth year]  [facility] of [facility]) CM/SW Contact:    Shelbie Hutching, RN Phone Number: 06/08/2022, 1:42 PM  Clinical Narrative:                 Patient admitted to the hospital with sepsis, required temporary pacemaker placement, TEE scheduled for today.  Scheduled for permanent pacemaker on Monday.   RNCM met with patient at the bedside yesterday.  Patient is from home where he lives with his daughter.  His primary care is over at the [facility].  Patient is independent at home and drives.  TOC will cont to follow.    Expected Discharge Plan: [place] Barriers to Discharge: Continued Medical Work up   Patient Goals and CMS Choice Patient states their goals for this hospitalization and ongoing recovery are:: to get better      Expected Discharge Plan and Services Expected Discharge Plan: [place]   Discharge Planning Services: CM Consult   Living arrangements for the past 2 months: Single Family Home                 DME Arranged: N/A                    Prior Living Arrangements/Services Living arrangements for the past 2 months: Single Family Home Lives with:: Adult Children Patient language and need for interpreter reviewed:: Yes Do you feel safe going back to the place where you live?: Yes      Need for Family Participation in Patient Care: Yes (Comment) Care giver support system in place?: Yes (comment)   Criminal Activity/Legal Involvement Pertinent to Current Situation/Hospitalization: No - Comment as needed  Activities of Daily Living Home Assistive Devices/Equipment: Cane (specify quad or straight), Walker (specify type), Eyeglasses ADL Screening (condition at time of admission) Patient's cognitive ability adequate to safely complete daily activities?: Yes Is the patient deaf or have  difficulty hearing?: No Does the patient have difficulty seeing, even when wearing glasses/contacts?: No Does the patient have difficulty concentrating, remembering, or making decisions?: No Patient able to express need for assistance with ADLs?: Yes Does the patient have difficulty dressing or bathing?: No Independently performs ADLs?: Yes (appropriate for developmental age) Does the patient have difficulty walking or climbing stairs?: No Weakness of Legs: None Weakness of Arms/Hands: None  Permission Sought/Granted Permission sought to share information with : Case Manager, Family Supports Permission granted to share information with : Yes, Verbal Permission Granted  Share Information with NAME: Berenice Bouton     Permission granted to share info w Relationship: daughter  Permission granted to share info w Contact Information: [phone]  Emotional Assessment Appearance:: Appears stated age Attitude/Demeanor/Rapport: Engaged Affect (typically observed): Accepting Orientation: : Oriented to Self, Oriented to Place, Oriented to  Time, Oriented to Situation Alcohol / Substance Use: Not Applicable Psych Involvement: No (comment)  Admission diagnosis:  Complete heart block ([place]) [I44.2] Sepsis ([place]) [A41.9] Sepsis with acute hypoxic respiratory failure, due to unspecified organism, unspecified whether septic shock present ([place]) [A41.9, R65.20, J96.01] Patient Active Problem List   Diagnosis Date Noted   Complete heart block ([place]) 06/06/2022   Sepsis  06/05/2022   Hypertension     urinary retention    Hypothyroidism    H/O heart valve replacement with mechanical valve    PCP:  [place]  [facility]   [facility], [place] [address] Phone: [phone] Fax: [phone]     Social Determinants of Health (SDOH) Interventions    Readmission Risk Interventions     No data to display

## 2022-06-08 NOTE — Assessment & Plan Note (Signed)
As evidenced by fever with a Tmax of 101.6, relative hypotension with systolic blood pressure in the 90s, lactic acidosis and pyuria. Patient has a history of BPH with urinary retention and does self-catheterization twice a day.  No urinary symptoms. Blood cultures positive for Pasteurella, pending susceptibility.   History of cat bite on the left anterior shin few days ago. Patient also has an history of mechanical heart valve. ID consulted for concern of Pasteurella bacteremia.  Antibiotics were switched to Zosyn. TEE was negative for endocarditis -Repeat blood cultures -Continue Zosyn

## 2022-06-08 NOTE — Plan of Care (Signed)
  Problem: Fluid Volume: Goal: Hemodynamic stability will improve 06/08/2022 1541 by Aretta Nip, RN Outcome: Progressing 06/08/2022 1540 by Aretta Nip, RN Outcome: Progressing   Problem: Clinical Measurements: Goal: Diagnostic test results will improve 06/08/2022 1541 by Aretta Nip, RN Outcome: Progressing 06/08/2022 1540 by Aretta Nip, RN Outcome: Progressing Goal: Signs and symptoms of infection will decrease 06/08/2022 1541 by Aretta Nip, RN Outcome: Progressing 06/08/2022 1540 by Aretta Nip, RN Outcome: Progressing   Problem: Respiratory: Goal: Ability to maintain adequate ventilation will improve 06/08/2022 1541 by Aretta Nip, RN Outcome: Progressing 06/08/2022 1540 by Aretta Nip, RN Outcome: Progressing   Problem: Education: Goal: Knowledge of General Education information will improve Description: Including pain rating scale, medication(s)/side effects and non-pharmacologic comfort measures 06/08/2022 1541 by Aretta Nip, RN Outcome: Progressing 06/08/2022 1540 by Aretta Nip, RN Outcome: Progressing   Problem: Health Behavior/Discharge Planning: Goal: Ability to manage health-related needs will improve 06/08/2022 1541 by Aretta Nip, RN Outcome: Progressing 06/08/2022 1540 by Aretta Nip, RN Outcome: Progressing   Problem: Clinical Measurements: Goal: Ability to maintain clinical measurements within normal limits will improve 06/08/2022 1541 by Aretta Nip, RN Outcome: Progressing 06/08/2022 1540 by Aretta Nip, RN Outcome: Progressing Goal: Will remain free from infection 06/08/2022 1541 by Aretta Nip, RN Outcome: Progressing 06/08/2022 1540 by Aretta Nip, RN Outcome: Progressing Goal: Diagnostic test results will improve 06/08/2022 1541 by Aretta Nip, RN Outcome: Progressing 06/08/2022 1540 by Aretta Nip, RN Outcome: Progressing Goal: Respiratory complications will improve 06/08/2022  1541 by Aretta Nip, RN Outcome: Progressing 06/08/2022 1540 by Aretta Nip, RN Outcome: Progressing Goal: Cardiovascular complication will be avoided 06/08/2022 1541 by Aretta Nip, RN Outcome: Progressing 06/08/2022 1540 by Aretta Nip, RN Outcome: Progressing   Problem: Nutrition: Goal: Adequate nutrition will be maintained 06/08/2022 1541 by Aretta Nip, RN Outcome: Progressing 06/08/2022 1540 by Aretta Nip, RN Outcome: Progressing   Problem: Coping: Goal: Level of anxiety will decrease 06/08/2022 1541 by Aretta Nip, RN Outcome: Progressing 06/08/2022 1540 by Aretta Nip, RN Outcome: Progressing   Problem: Elimination: Goal: Will not experience complications related to bowel motility 06/08/2022 1541 by Aretta Nip, RN Outcome: Progressing 06/08/2022 1540 by Aretta Nip, RN Outcome: Progressing Goal: Will not experience complications related to urinary retention 06/08/2022 1541 by Aretta Nip, RN Outcome: Progressing 06/08/2022 1540 by Aretta Nip, RN Outcome: Progressing   Problem: Pain Managment: Goal: General experience of comfort will improve 06/08/2022 1541 by Aretta Nip, RN Outcome: Progressing 06/08/2022 1540 by Aretta Nip, RN Outcome: Progressing   Problem: Safety: Goal: Ability to remain free from injury will improve 06/08/2022 1541 by Aretta Nip, RN Outcome: Progressing 06/08/2022 1540 by Aretta Nip, RN Outcome: Progressing   Problem: Skin Integrity: Goal: Risk for impaired skin integrity will decrease 06/08/2022 1541 by Aretta Nip, RN Outcome: Progressing 06/08/2022 1540 by Aretta Nip, RN Outcome: Progressing

## 2022-06-08 NOTE — Progress Notes (Signed)
SUBJECTIVE: Patient laying comfortably in bed sleeping. Easy to arouse. No active complaints.   Vitals:   06/08/22 0600 06/08/22 0700 06/08/22 0739 06/08/22 0800  BP: 103/66 115/61  120/67  Pulse: 70 68  68  Resp: 16 16  19   Temp:   98 F (36.7 C)   TempSrc:   Oral   SpO2: 98% 91%  98%  Weight:      Height:        Intake/Output Summary (Last 24 hours) at 06/08/2022 0826 Last data filed at 06/08/2022 0800 Gross per 24 hour  Intake 1425.89 ml  Output 1230 ml  Net 195.89 ml    LABS: Basic Metabolic Panel: Recent Labs    06/05/22 1051 06/06/22 0412  NA 138 138  K 3.9 3.4*  CL 108 108  CO2 21* 23  GLUCOSE 138* 98  BUN 32* 29*  CREATININE 1.50* 1.54*  CALCIUM 8.7* 8.2*   Liver Function Tests: Recent Labs    06/05/22 1051  AST 21  ALT 13  ALKPHOS 66  BILITOT 1.1  PROT 6.9  ALBUMIN 3.7   No results for input(s): "LIPASE", "AMYLASE" in the last 72 hours. CBC: Recent Labs    06/05/22 1051 06/06/22 0412  WBC 9.5 7.4  NEUTROABS 8.9*  --   HGB 10.4* 9.2*  HCT 31.9* 28.9*  MCV 88.9 91.7  PLT 170 146*   Cardiac Enzymes: Recent Labs    06/05/22 2027  CKTOTAL 1,513*   BNP: Invalid input(s): "POCBNP" D-Dimer: No results for input(s): "DDIMER" in the last 72 hours. Hemoglobin A1C: No results for input(s): "HGBA1C" in the last 72 hours. Fasting Lipid Panel: No results for input(s): "CHOL", "HDL", "LDLCALC", "TRIG", "CHOLHDL", "LDLDIRECT" in the last 72 hours. Thyroid Function Tests: No results for input(s): "TSH", "T4TOTAL", "T3FREE", "THYROIDAB" in the last 72 hours.  Invalid input(s): "FREET3" Anemia Panel: No results for input(s): "VITAMINB12", "FOLATE", "FERRITIN", "TIBC", "IRON", "RETICCTPCT" in the last 72 hours.   PHYSICAL EXAM General: Well developed, well nourished, in no acute distress HEENT:  Normocephalic and atramatic Neck:  No JVD.  Lungs: Clear bilaterally to auscultation and percussion. Heart: HRRR . Normal S1 and S2 without gallops  or murmurs.  Abdomen: Bowel sounds are positive, abdomen soft and non-tender  Msk:  Back normal, normal gait. Normal strength and tone for age. Extremities: No clubbing, cyanosis or edema.   Neuro: Alert and oriented X 3. Psych:  Good affect, responds appropriately  TELEMETRY: NSR, HR 70 bpm  ASSESSMENT AND PLAN: Patient resting comfortably in bed. Discussed TEE scheduled for today. Patient had no questions. States need to discuss with son. Will continue to follow. Plan for TEE today to rule out endocarditis.  Principal Problem:   Sepsis  Active Problems:   Hypertension    urinary retention   Hypothyroidism   H/O heart valve replacement with mechanical valve   Complete heart block (HCC)    Cathryn Gallery, FNP-C 06/08/2022 8:26 AM

## 2022-06-08 NOTE — Progress Notes (Signed)
ID Pt feeling better today Had TEE this morning  BP 134/76   Pulse 69   Temp 98 F (36.7 C) (Oral)   Resp (!) 24   Ht 5\' 6"  (1.676 m)   Wt 78.5 kg   SpO2 98%   BMI 27.93 kg/m   O/e awake and alert Chest b/l air entry Hss1s2- Paced Abd soft Cns non focal Left leg erythema better   Labs Micro-pasteurella bacteremia     Latest Ref Rng & Units 06/08/2022    7:32 AM 06/06/2022    4:12 AM 06/05/2022   10:51 AM  CBC  WBC 4.0 - 10.5 K/uL 6.6  7.4  9.5   Hemoglobin 13.0 - 17.0 g/dL 06/07/2022  9.2  67.3   Hematocrit 39.0 - 52.0 % 32.6  28.9  31.9   Platelets 150 - 400 K/uL 137  146  170        Latest Ref Rng & Units 06/08/2022    7:32 AM 06/06/2022    4:12 AM 06/05/2022   10:51 AM  CMP  Glucose 70 - 99 mg/dL 06/07/2022  98  379   BUN 8 - 23 mg/dL 23  29  32   Creatinine 0.61 - 1.24 mg/dL 024 - 0.97 mg/dL 3.53    2.99  2.42  6.83   Sodium 135 - 145 mmol/L 138  138  138   Potassium 3.5 - 5.1 mmol/L 3.5  3.4  3.9   Chloride 98 - 111 mmol/L 109  108  108   CO2 22 - 32 mmol/L 23  23  21    Calcium 8.9 - 10.3 mg/dL 8.0  8.2  8.7   Total Protein 6.5 - 8.1 g/dL   6.9   Total Bilirubin 0.3 - 1.2 mg/dL   1.1   Alkaline Phos 38 - 126 U/L   66   AST 15 - 41 U/L   21   ALT 0 - 44 U/L   13       Impression/recommendation Pasteurella bacteremia secondary to cat bite- on  unasyn TEE negative for prosthetic valve endocarditis  Urinary retention- culture only staph epi- skin contaminant  BPH with self cath at home  CHB- has temporary pacemaker Repeat Blood culture sent- if neg can get permanent pacemaker early next week  Discussed with patient and care team ID will follow him peripherally this weekend Call if needed

## 2022-06-09 DIAGNOSIS — R7881 Bacteremia: Secondary | ICD-10-CM | POA: Diagnosis not present

## 2022-06-09 DIAGNOSIS — I442 Atrioventricular block, complete: Secondary | ICD-10-CM | POA: Diagnosis not present

## 2022-06-09 DIAGNOSIS — R338 Other retention of urine: Secondary | ICD-10-CM | POA: Diagnosis not present

## 2022-06-09 DIAGNOSIS — W5501XA Bitten by cat, initial encounter: Secondary | ICD-10-CM | POA: Diagnosis not present

## 2022-06-09 LAB — PROTIME-INR
INR: 1.3 — ABNORMAL HIGH (ref 0.8–1.2)
Prothrombin Time: 15.7 seconds — ABNORMAL HIGH (ref 11.4–15.2)

## 2022-06-09 LAB — CBC
HCT: 30.9 % — ABNORMAL LOW (ref 39.0–52.0)
Hemoglobin: 10.3 g/dL — ABNORMAL LOW (ref 13.0–17.0)
MCH: 28.6 pg (ref 26.0–34.0)
MCHC: 33.3 g/dL (ref 30.0–36.0)
MCV: 85.8 fL (ref 80.0–100.0)
Platelets: 144 10*3/uL — ABNORMAL LOW (ref 150–400)
RBC: 3.6 MIL/uL — ABNORMAL LOW (ref 4.22–5.81)
RDW: 14.1 % (ref 11.5–15.5)
WBC: 6.3 10*3/uL (ref 4.0–10.5)
nRBC: 0 % (ref 0.0–0.2)

## 2022-06-09 LAB — HEPARIN LEVEL (UNFRACTIONATED): Heparin Unfractionated: 0.37 IU/mL (ref 0.30–0.70)

## 2022-06-09 MED ORDER — AMLODIPINE BESYLATE 5 MG PO TABS
5.0000 mg | ORAL_TABLET | Freq: Every day | ORAL | Status: DC
  Administered 2022-06-09 – 2022-06-13 (×5): 5 mg via ORAL
  Filled 2022-06-09 (×5): qty 1

## 2022-06-09 MED ORDER — ORAL CARE MOUTH RINSE: 15.0000 mL | OROMUCOSAL | Status: DC | PRN

## 2022-06-09 NOTE — Consult Note (Signed)
ANTICOAGULATION CONSULT NOTE  Pharmacy Consult for IV Heparin Indication:  Peri-procedural bridging for history of mechanical mitral valve on warfarin  Patient Measurements: Height: 5\' 6"  (167.6 cm) Weight: 78.5 kg (173 lb 1 oz) IBW/kg (Calculated) : 63.8 Heparin Dosing Weight: 78.5 kg  Labs: Recent Labs    06/06/22 0600 06/06/22 1603 06/06/22 2351 06/07/22 0126 06/07/22 2157 06/08/22 0732 06/08/22 2037 06/09/22 0456  HGB  --   --   --   --   --  10.7*  --  10.3*  HCT  --   --   --   --   --  32.6*  --  30.9*  PLT  --   --   --   --   --  137*  --  144*  LABPROT  --    < >  --  16.1*  --  13.4  --  15.7*  INR  --    < >  --  1.3*  --  1.0  --  1.3*  HEPARINUNFRC  --   --   --   --    < > 0.16* 0.37 0.37  CREATININE  --   --   --   --   --  1.03  1.00  --   --   TROPONINIHS 297*  --  213* 203*  --   --   --   --    < > = values in this interval not displayed.     Estimated Creatinine Clearance: 45.5 mL/min (by C-G formula based on SCr of 1 mg/dL).   Medications:  Warfarin 1.25 mg on Tu and 2.5 mg every other day (TWD 16.25 mg)   Assessment: [name] with medical history including diastolic CHF, HTN, BPH, urinary retention / self catheterization, mechanical mitral valve replacement who is admitted with sepsis secondary to presumed UTI complicated by bacteremia and complete heart block s/p temporary pacemaker insertion on 7/26. Warfarin was reversed prior to procedure and is now subtherapeutic. Further instructions to hold warfarin pending anticipated permanent pacemaker placement on 7/31. Patient will also likely need to undergo TEE to rule out endocarditis. Plan is to keep patient on heparin for now. Pharmacy consulted to initiate and manage IV heparin for history of mechanical mitral valve.  Goal of Therapy:  Heparin level 0.3-0.7 units/ml INR 2.5 - 3.5 (when warfarin is resumed) Monitor platelets by anticoagulation protocol: Yes  7/27 2157 HL 0.12;  subtherapeutic, 1100 un/hr 7/28 0732 HL 0.16; subtherapeutic, 1350 un/hr 7/28 2037 HL 0.37, therapeutic x 1 7/29 0456 HL 0.37, therapeutic x2   Plan:  --Heparin level is therapeutic x 2 --Continue Heparin infusion at 1600 units/hr --Recheck HL on 7/30 with AM labs --Daily CBC per protocol while on IV heparin  8/30 Clinical Pharmacist 06/09/2022 5:59 AM

## 2022-06-09 NOTE — Assessment & Plan Note (Signed)
Heart rate improved with temporary pacemaker in place. Permanent pacemaker to be placed on Monday. Continue to feel weak. -Appreciate cardiology recommendations. -Continue to monitor -Heparin infusion until the procedure for mechanical heart valve 

## 2022-06-09 NOTE — Assessment & Plan Note (Signed)
As evidenced by fever with a Tmax of 101.6, relative hypotension with systolic blood pressure in the 90s, lactic acidosis and pyuria. Patient has a history of BPH with urinary retention and does self-catheterization twice a day.  No urinary symptoms. Blood cultures positive for Pasteurella, pending susceptibility.   History of cat bite on the left anterior shin few days ago. Patient also has an history of mechanical heart valve. ID consulted for concern of Pasteurella bacteremia.  Antibiotics were switched to Zosyn. Repeat blood cultures negative. TEE was negative for endocarditis -Continue Zosyn

## 2022-06-09 NOTE — Assessment & Plan Note (Signed)
Blood pressure started trending up.  Heart rate remained in 60s and 70s. -Restart home amlodipine Continue holding  lisinopril and metoprolol

## 2022-06-09 NOTE — Progress Notes (Signed)
Progress Note   Patient: Dean Cruz:[id number] DOB: 02-09-1927 DOA: 06/05/2022     4 DOS: the patient was seen and examined on 06/09/2022   Brief hospital course: Taken from H&P.  Kaliq Lege is a 88 y.o. male with medical history significant for chronic diastolic dysfunction CHF, hypertension, BPH with history of urinary retention and self-catheterization twice daily 6 at home who was brought into the ER by EMS for evaluation of generalized weakness. Patient states that he woke up on the morning of his admission and was trying to put his socks on when he slid out off his chair landing on the floor.  He was too weak to get up and so he sat on the floor for about an hour until his granddaughter came home and found him.  She called EMS and he was found to be febrile with a Tmax of 101.5 and hypotensive with a blood pressure of 97/50. He states that he was nauseous in route to the hospital and had an episode of emesis in the ambulance.  His oral intake has been poor over the last several days and he complains of feeling very weak but denies any dizziness or lightheadedness. He denies having any chest pain, no shortness of breath, no headache, no changes in his bowel habits, no leg swelling, no blurred vision or focal deficit. He had a recent cat bite involving his left leg and has some redness over the left anterior leg.   Per EMS patient had room air pulse oximetry of 90% and was placed on 2 L of oxygen.  Patient met sepsis criteria with fever and tachypnea.  Mild lactic acidosis at 2.1, UA with significant pyuria, bacteriuria and hematuria.  Blood cultures with gram-negative diplococci.  Creatinine of 1.5 with unknown baseline as his care was in [place]. Patient received cefepime and vancomycin in ED and was continued on cefepime.  7/26: Patient overnight developed symptomatic bradycardia requiring one-time dose of atropine.  This morning he was still feeling very lethargic, EKG was  obtained due to heart rate being in high 80s to low 40s and found to have complete heart block.  Per patient his normal heart rate is normal in 60s to 70s.  Cardiology was consulted and a temporary percutaneous pacemaker was placed.  He was also given vitamin K for supratherapeutic INR and will need a pacemaker once INR normalizes. ID was also consulted for concern of Pasteurella bacteremia.  Patient's son wants him to be transferred to [place]. will not be safe at this time but we will contact them to see if that is a possibility.  7/27: Patient remained stable, continued to feel very weak.  Temporary pacemaker in place and a permanent pacemaker will be placed on Monday.  Will be going for TEE tomorrow to rule out endocarditis.  Blood cultures with Pasteurella multocida, pending susceptibility. Antibiotics switched to Zosyn by ID.  7/28: TEE was negative for endocarditis.  Temporary pacemaker will be placed on Monday.  Repeat blood cultures ordered today.  Urine cultures with Staph epidermidis, which is most likely a contaminant.  7/29: Repeat blood cultures done yesterday, 7/28 are negative in 24 hours.  Permanent pacemaker will be placed on Monday.   Assessment and Plan: * Sepsis  As evidenced by fever with a Tmax of 101.6, relative hypotension with systolic blood pressure in the 90s, lactic acidosis and pyuria. Patient has a history of BPH with urinary retention and does self-catheterization twice a day.  No urinary symptoms. Blood  cultures positive for Pasteurella, pending susceptibility.   History of cat bite on the left anterior shin few days ago. Patient also has an history of mechanical heart valve. ID consulted for concern of Pasteurella bacteremia.  Antibiotics were switched to Zosyn. Repeat blood cultures negative. TEE was negative for endocarditis -Continue Zosyn   Complete heart block (HCC) Heart rate improved with temporary pacemaker in place. Permanent pacemaker to be placed on  Monday. Continue to feel weak. -Appreciate cardiology recommendations. -Continue to monitor -Heparin infusion until the procedure for mechanical heart valve  Hypertension Blood pressure started trending up.  Heart rate remained in 60s and 70s. -Restart home amlodipine Continue holding  lisinopril and metoprolol   urinary retention Patient has a known history of BPH and was noted to have urinary retention upon arrival to the ER.  History of self-catheterization. Imaging showed markedly distended urinary bladder and a Foley catheter was placed in the ER -Continue Flomax and finasteride  Hypothyroidism Stable Continue Synthroid  H/O heart valve replacement with mechanical valve Patient has a history of mechanical heart valve (Aortic) and is on Coumadin.Marland Kitchen Received 1 dose of IV vitamin K to reverse for cardiac procedure INR at 1.3 today -Heparin infusion until permanent pacemaker placed on Monday   Subjective: Patient was watching TV comfortably when seen today.  No new complaints.  He was waiting for his permanent pacemaker to be placed on Monday.  Physical Exam: Vitals:   06/09/22 1133 06/09/22 1200 06/09/22 1230 06/09/22 1300  BP:  133/66 128/71 (!) 145/78  Pulse:  69 69 61  Resp:  19 17 (!) 25  Temp: 97.8 F (36.6 C)  98.4 F (36.9 C)   TempSrc: Oral  Oral   SpO2:  95% 97% 94%  Weight:      Height:       General.     In no acute distress. Pulmonary.  Lungs clear bilaterally, normal respiratory effort. CV.  Regular rate and rhythm, no JVD, rub or murmur. Abdomen.  Soft, nontender, nondistended, BS positive. CNS.  Alert and oriented .  No focal neurologic deficit. Extremities.  No edema, no cyanosis, pulses intact and symmetrical. Psychiatry.  Judgment and insight appears normal.  Data Reviewed: Prior data reviewed  Family Communication:   Disposition: Status is: Inpatient Remains inpatient appropriate because: Severity of illness, pending permanent pacemaker  placement.   Planned Discharge Destination: Home  DVT prophylaxis.  Heparin infusion Time spent: 45 minutes  This record has been created using Systems analyst. Errors have been sought and corrected,but may not always be located. Such creation errors do not reflect on the standard of care.  Author: Lorella Nimrod, MD 06/09/2022 2:42 PM  For on call review [email or web address].

## 2022-06-10 DIAGNOSIS — W5501XA Bitten by cat, initial encounter: Secondary | ICD-10-CM | POA: Diagnosis not present

## 2022-06-10 DIAGNOSIS — I442 Atrioventricular block, complete: Secondary | ICD-10-CM | POA: Diagnosis not present

## 2022-06-10 DIAGNOSIS — R7881 Bacteremia: Secondary | ICD-10-CM | POA: Diagnosis not present

## 2022-06-10 DIAGNOSIS — R338 Other retention of urine: Secondary | ICD-10-CM | POA: Diagnosis not present

## 2022-06-10 LAB — PROTIME-INR
INR: 1.3 — ABNORMAL HIGH (ref 0.8–1.2)
Prothrombin Time: 15.7 seconds — ABNORMAL HIGH (ref 11.4–15.2)

## 2022-06-10 LAB — CBC
HCT: 30.7 % — ABNORMAL LOW (ref 39.0–52.0)
Hemoglobin: 10.2 g/dL — ABNORMAL LOW (ref 13.0–17.0)
MCH: 28.9 pg (ref 26.0–34.0)
MCHC: 33.2 g/dL (ref 30.0–36.0)
MCV: 87 fL (ref 80.0–100.0)
Platelets: 150 10*3/uL (ref 150–400)
RBC: 3.53 MIL/uL — ABNORMAL LOW (ref 4.22–5.81)
RDW: 13.9 % (ref 11.5–15.5)
WBC: 7 10*3/uL (ref 4.0–10.5)
nRBC: 0 % (ref 0.0–0.2)

## 2022-06-10 LAB — HEPARIN LEVEL (UNFRACTIONATED): Heparin Unfractionated: 0.43 IU/mL (ref 0.30–0.70)

## 2022-06-10 NOTE — Consult Note (Signed)
ANTICOAGULATION CONSULT NOTE  Pharmacy Consult for IV Heparin Indication:  Peri-procedural bridging for history of mechanical mitral valve on warfarin  Patient Measurements: Height: 5\' 6"  (167.6 cm) Weight: 78.5 kg (173 lb 1 oz) IBW/kg (Calculated) : 63.8 Heparin Dosing Weight: 78.5 kg  Labs: Recent Labs    06/08/22 0732 06/08/22 2037 06/09/22 0456 06/10/22 0459  HGB 10.7*  --  10.3* 10.2*  HCT 32.6*  --  30.9* 30.7*  PLT 137*  --  144* 150  LABPROT 13.4  --  15.7* 15.7*  INR 1.0  --  1.3* 1.3*  HEPARINUNFRC 0.16* 0.37 0.37 0.43  CREATININE 1.03  1.00  --   --   --      Estimated Creatinine Clearance: 45.5 mL/min (by C-G formula based on SCr of 1 mg/dL).   Medications:  Warfarin 1.25 mg on Tu and 2.5 mg every other day (TWD 16.25 mg)   Assessment: [name] with medical history including diastolic CHF, HTN, BPH, urinary retention / self catheterization, mechanical mitral valve replacement who is admitted with sepsis secondary to presumed UTI complicated by bacteremia and complete heart block s/p temporary pacemaker insertion on 7/26. Warfarin was reversed prior to procedure and is now subtherapeutic. Further instructions to hold warfarin pending anticipated permanent pacemaker placement on 7/31. Patient will also likely need to undergo TEE to rule out endocarditis. Plan is to keep patient on heparin for now. Pharmacy consulted to initiate and manage IV heparin for history of mechanical mitral valve.  Goal of Therapy:  Heparin level 0.3-0.7 units/ml INR 2.5 - 3.5 (when warfarin is resumed) Monitor platelets by anticoagulation protocol: Yes  7/27 2157 HL 0.12; subtherapeutic, 1100 un/hr 7/28 0732 HL 0.16; subtherapeutic, 1350 un/hr 7/28 2037 HL 0.37, therapeutic x 1 7/29 0456 HL 0.37, therapeutic x2 7/30 0459 HL 0.43, therapeutic x3   Plan:  --Heparin level is therapeutic x 3 --Continue Heparin infusion at 1600 units/hr --Recheck HL on 7/31 with AM  labs --Daily CBC per protocol while on IV heparin  8/31 Clinical Pharmacist 06/10/2022 6:18 AM

## 2022-06-10 NOTE — Progress Notes (Signed)
Progress Note   Patient: Dean Cruz DOB: [birth year] DOA: 06/05/2022     5 DOS: the patient was seen and examined on 06/10/2022   Brief hospital course: Taken from H&P.  Dean Cruz is a 88 y.o. male with medical history significant for chronic diastolic dysfunction CHF, hypertension, BPH with history of urinary retention and self-catheterization twice daily 6 at home who was brought into the ER by EMS for evaluation of generalized weakness. Patient states that he woke up on the morning of his admission and was trying to put his socks on when he slid out off his chair landing on the floor.  He was too weak to get up and so he sat on the floor for about an hour until his granddaughter came home and found him.  She called EMS and he was found to be febrile with a Tmax of 101.5 and hypotensive with a blood pressure of 97/50. He states that he was nauseous in route to the hospital and had an episode of emesis in the ambulance.  His oral intake has been poor over the last several days and he complains of feeling very weak but denies any dizziness or lightheadedness. He denies having any chest pain, no shortness of breath, no headache, no changes in his bowel habits, no leg swelling, no blurred vision or focal deficit. He had a recent cat bite involving his left leg and has some redness over the left anterior leg.   Per EMS patient had room air pulse oximetry of 90% and was placed on 2 L of oxygen.  Patient met sepsis criteria with fever and tachypnea.  Mild lactic acidosis at 2.1, UA with significant pyuria, bacteriuria and hematuria.  Blood cultures with gram-negative diplococci.  Creatinine of 1.5 with unknown baseline as his care was in [place]. Patient received cefepime and vancomycin in ED and was continued on cefepime.  7/26: Patient overnight developed symptomatic bradycardia requiring one-time dose of atropine.  This morning he was still feeling very lethargic, EKG was  obtained due to heart rate being in high 80s to low 40s and found to have complete heart block.  Per patient his normal heart rate is normal in 60s to 70s.  Cardiology was consulted and a temporary percutaneous pacemaker was placed.  He was also given vitamin K for supratherapeutic INR and will need a pacemaker once INR normalizes. ID was also consulted for concern of Pasteurella bacteremia.  Patient's son wants him to be transferred to [place]. will not be safe at this time but we will contact them to see if that is a possibility.  7/27: Patient remained stable, continued to feel very weak.  Temporary pacemaker in place and a permanent pacemaker will be placed on Monday.  Will be going for TEE tomorrow to rule out endocarditis.  Blood cultures with Pasteurella multocida, pending susceptibility. Antibiotics switched to Zosyn by ID.  7/28: TEE was negative for endocarditis.  Temporary pacemaker will be placed on Monday.  Repeat blood cultures ordered today.  Urine cultures with Staph epidermidis, which is most likely a contaminant.  7/29: Repeat blood cultures done yesterday, 7/28 are negative in 24 hours.  Permanent pacemaker will be placed on Monday.  7/30: Hemodynamically stable.  Repeat blood cultures from 7/28 remain negative.  Permanent pacemaker to be placed on Monday.  Rest of the labs stable.   Assessment and Plan: * Sepsis  As evidenced by fever with a Tmax of 101.6, relative hypotension with systolic blood pressure in  the 90s, lactic acidosis and pyuria. Patient has a history of BPH with urinary retention and does self-catheterization twice a day.  No urinary symptoms. Blood cultures positive for Pasteurella, pending susceptibility.   History of cat bite on the left anterior shin few days ago. Patient also has an history of mechanical heart valve. ID consulted for concern of Pasteurella bacteremia.  Antibiotics were switched to Zosyn. Repeat blood cultures negative. TEE was negative for  endocarditis -Continue Zosyn   Complete heart block (HCC) Heart rate improved with temporary pacemaker in place. Permanent pacemaker to be placed on Monday. Continue to feel weak. -Appreciate cardiology recommendations. -Continue to monitor -Heparin infusion until the procedure for mechanical heart valve  Hypertension Blood pressure within goal after restarting home amlodipine -Continue home amlodipine Continue holding  lisinopril and metoprolol   urinary retention Patient has a known history of BPH and was noted to have urinary retention upon arrival to the ER.  History of self-catheterization. Imaging showed markedly distended urinary bladder and a Foley catheter was placed in the ER -Continue Flomax and finasteride  Hypothyroidism Stable Continue Synthroid  H/O heart valve replacement with mechanical valve Patient has a history of mechanical heart valve (Aortic) and is on Coumadin.Marland Kitchen Received 1 dose of IV vitamin K to reverse for cardiac procedure INR at 1.3 today -Heparin infusion until permanent pacemaker placed on Monday  Subjective: Patient was seen and examined today.  No new complaints.  Would like to get home as soon as possible.  Physical Exam: Vitals:   06/10/22 0440 06/10/22 0500 06/10/22 0700 06/10/22 0800  BP:  132/66 115/68 127/73  Pulse:  71 69 70  Resp:  $Remo'18 17 14  'cYvMY$ Temp: 97.9 F (36.6 C)   98.1 F (36.7 C)  TempSrc: Axillary   Oral  SpO2:  95% 95% 92%  Weight:      Height:       General.  Pleasant elderly man, in no acute distress. Pulmonary.  Lungs clear bilaterally, normal respiratory effort. CV.  Regular rate and rhythm, no JVD, rub or murmur. Abdomen.  Soft, nontender, nondistended, BS positive. CNS.  Alert and oriented .  No focal neurologic deficit. Extremities.  No edema, no cyanosis, pulses intact and symmetrical. Psychiatry.  Judgment and insight appears normal.  Data Reviewed: Prior data reviewed  Family Communication: Discussed  with son on phone.  Disposition: Status is: Inpatient Remains inpatient appropriate because: Severity of illness, awaiting procedure.   Planned Discharge Destination: Home with Home Health  Time spent: 45 minutes  This record has been created using Systems analyst. Errors have been sought and corrected,but may not always be located. Such creation errors do not reflect on the standard of care.  Author: Lorella Nimrod, MD 06/10/2022 1:42 PM  For on call review [email or web address].

## 2022-06-10 NOTE — Assessment & Plan Note (Signed)
Blood pressure within goal after restarting home amlodipine -Continue home amlodipine Continue holding  lisinopril and metoprolol

## 2022-06-10 NOTE — Assessment & Plan Note (Signed)
As evidenced by fever with a Tmax of 101.6, relative hypotension with systolic blood pressure in the 90s, lactic acidosis and pyuria. Patient has a history of BPH with urinary retention and does self-catheterization twice a day.  No urinary symptoms. Blood cultures positive for Pasteurella, pending susceptibility.   History of cat bite on the left anterior shin few days ago. Patient also has an history of mechanical heart valve. ID consulted for concern of Pasteurella bacteremia.  Antibiotics were switched to Zosyn. Repeat blood cultures negative. TEE was negative for endocarditis -Continue Zosyn  

## 2022-06-10 NOTE — Progress Notes (Signed)
SUBJECTIVE: Dean Cruz is a 88 y.o. male with medical history significant for chronic diastolic dysfunction, CHF, hypertension, BPH with history of urinary retention and self-catheterization twice daily at home who was brought into the ER by EMS for evaluation of generalized weakness. Met criteria for sepsis. Found to be in [place].   Temporary pacemaker placed 7/27 for complete heart block.  TEE 7/28 negative for endocarditis.    Vitals:   06/10/22 0440 06/10/22 0500 06/10/22 0700 06/10/22 0800  BP:  132/66 115/68 127/73  Pulse:  71 69 70  Resp:  $Remo'18 17 14  'lYnLB$ Temp: 97.9 F (36.6 C)   98.1 F (36.7 C)  TempSrc: Axillary   Oral  SpO2:  95% 95% 92%  Weight:      Height:        Intake/Output Summary (Last 24 hours) at 06/10/2022 1217 Last data filed at 06/10/2022 1000 Gross per 24 hour  Intake 1449.44 ml  Output 1745 ml  Net -295.56 ml    LABS: Basic Metabolic Panel: Recent Labs    06/08/22 0732  NA 138  K 3.5  CL 109  CO2 23  GLUCOSE 102*  BUN 23  CREATININE 1.03  1.00  CALCIUM 8.0*   Liver Function Tests: No results for input(s): "AST", "ALT", "ALKPHOS", "BILITOT", "PROT", "ALBUMIN" in the last 72 hours. No results for input(s): "LIPASE", "AMYLASE" in the last 72 hours. CBC: Recent Labs    06/09/22 0456 06/10/22 0459  WBC 6.3 7.0  HGB 10.3* 10.2*  HCT 30.9* 30.7*  MCV 85.8 87.0  PLT 144* 150   Cardiac Enzymes: No results for input(s): "CKTOTAL", "CKMB", "CKMBINDEX", "TROPONINI" in the last 72 hours. BNP: Invalid input(s): "POCBNP" D-Dimer: No results for input(s): "DDIMER" in the last 72 hours. Hemoglobin A1C: No results for input(s): "HGBA1C" in the last 72 hours. Fasting Lipid Panel: No results for input(s): "CHOL", "HDL", "LDLCALC", "TRIG", "CHOLHDL", "LDLDIRECT" in the last 72 hours. Thyroid Function Tests: No results for input(s): "TSH", "T4TOTAL", "T3FREE", "THYROIDAB" in the last 72 hours.  Invalid input(s): "FREET3" Anemia Panel: No results  for input(s): "VITAMINB12", "FOLATE", "FERRITIN", "TIBC", "IRON", "RETICCTPCT" in the last 72 hours.   PHYSICAL EXAM General: Well developed, well nourished, in no acute distress HEENT:  Normocephalic and atramatic Neck:  No JVD.  Lungs: Clear bilaterally to auscultation and percussion. Heart: HRRR . Normal S1 and S2 without gallops or murmurs.  Abdomen: Bowel sounds are positive, abdomen soft and non-tender  Msk:  Back normal, normal gait. Normal strength and tone for age. Extremities: No clubbing, cyanosis or edema.   Neuro: Alert and oriented X 3. Psych:  Good affect, responds appropriately  TELEMETRY: NSR, HR 70 bpm  ASSESSMENT AND PLAN: Patient resting comfortably in bed. No active complaints. Scheduled for PPP on 06/11/22 with Dr. Lorinda Creed. Patient ready to proceed.   Principal Problem:   Sepsis  Active Problems:   Hypertension    urinary retention   Hypothyroidism   H/O heart valve replacement with mechanical valve   Complete heart block (HCC)    Selina Tapper, FNP-C 06/10/2022 12:17 PM

## 2022-06-10 NOTE — Assessment & Plan Note (Signed)
Heart rate improved with temporary pacemaker in place. Permanent pacemaker to be placed on Monday. Continue to feel weak. -Appreciate cardiology recommendations. -Continue to monitor -Heparin infusion until the procedure for mechanical heart valve

## 2022-06-11 ENCOUNTER — Encounter: Payer: Self-pay | Admitting: Cardiovascular Disease

## 2022-06-11 ENCOUNTER — Encounter
Admission: EM | Disposition: A | Discharge status: Home / Self Care | Payer: Self-pay | Source: Home / Self Care | Attending: Internal Medicine

## 2022-06-11 ENCOUNTER — Inpatient Hospital Stay: Payer: Self-pay

## 2022-06-11 DIAGNOSIS — R338 Other retention of urine: Secondary | ICD-10-CM | POA: Diagnosis not present

## 2022-06-11 DIAGNOSIS — R7881 Bacteremia: Secondary | ICD-10-CM | POA: Diagnosis not present

## 2022-06-11 DIAGNOSIS — I442 Atrioventricular block, complete: Secondary | ICD-10-CM | POA: Diagnosis not present

## 2022-06-11 DIAGNOSIS — W5501XA Bitten by cat, initial encounter: Secondary | ICD-10-CM | POA: Diagnosis not present

## 2022-06-11 HISTORY — PX: PACEMAKER LEADLESS INSERTION: EP1219

## 2022-06-11 LAB — PROTIME-INR
INR: 1.4 — ABNORMAL HIGH (ref 0.8–1.2)
Prothrombin Time: 16.9 seconds — ABNORMAL HIGH (ref 11.4–15.2)

## 2022-06-11 LAB — HEPARIN LEVEL (UNFRACTIONATED): Heparin Unfractionated: 0.37 IU/mL (ref 0.30–0.70)

## 2022-06-11 SURGERY — PACEMAKER LEADLESS INSERTION
Anesthesia: Moderate Sedation

## 2022-06-11 MED ORDER — MIDAZOLAM HCL 2 MG/2ML IJ SOLN
INTRAMUSCULAR | Status: DC | PRN
  Administered 2022-06-11: 1 mg via INTRAVENOUS

## 2022-06-11 MED ORDER — SODIUM CHLORIDE 0.9 % IV SOLN
250.0000 mL | INTRAVENOUS | Status: DC | PRN
  Administered 2022-06-12: 250 mL via INTRAVENOUS

## 2022-06-11 MED ORDER — ONDANSETRON HCL 4 MG/2ML IJ SOLN: 4.0000 mg | Freq: Four times a day (QID) | INTRAMUSCULAR | Status: DC | PRN

## 2022-06-11 MED ORDER — IOHEXOL 300 MG/ML  SOLN
INTRAMUSCULAR | Status: DC | PRN
  Administered 2022-06-11: 9 mL

## 2022-06-11 MED ORDER — WARFARIN - PHARMACIST DOSING INPATIENT: Freq: Every day | Status: DC

## 2022-06-11 MED ORDER — FENTANYL CITRATE (PF) 100 MCG/2ML IJ SOLN
INTRAMUSCULAR | Status: DC | PRN
  Administered 2022-06-11: 25 ug via INTRAVENOUS

## 2022-06-11 MED ORDER — HEPARIN (PORCINE) IN NACL 1000-0.9 UT/500ML-% IV SOLN
INTRAVENOUS | Status: DC | PRN
  Administered 2022-06-11: 1000 mL

## 2022-06-11 MED ORDER — ENOXAPARIN SODIUM 80 MG/0.8ML IJ SOSY: 1.0000 mg/kg | PREFILLED_SYRINGE | Freq: Two times a day (BID) | INTRAMUSCULAR | Status: DC

## 2022-06-11 MED ORDER — SODIUM CHLORIDE 0.9 % IV SOLN
80.0000 mg | INTRAVENOUS | Status: DC
  Filled 2022-06-11: qty 2

## 2022-06-11 MED ORDER — HEPARIN SODIUM (PORCINE) 1000 UNIT/ML IJ SOLN
INTRAMUSCULAR | Status: AC
  Filled 2022-06-11: qty 10

## 2022-06-11 MED ORDER — LIDOCAINE HCL 1 % IJ SOLN
INTRAMUSCULAR | Status: AC
  Filled 2022-06-11: qty 20

## 2022-06-11 MED ORDER — ENOXAPARIN SODIUM 80 MG/0.8ML IJ SOSY
1.0000 mg/kg | PREFILLED_SYRINGE | Freq: Two times a day (BID) | INTRAMUSCULAR | Status: DC
  Administered 2022-06-12 – 2022-06-13 (×3): 77.5 mg via SUBCUTANEOUS
  Filled 2022-06-11 (×3): qty 0.8

## 2022-06-11 MED ORDER — SODIUM CHLORIDE 0.9% FLUSH: 3.0000 mL | INTRAVENOUS | Status: DC | PRN

## 2022-06-11 MED ORDER — FENTANYL CITRATE (PF) 100 MCG/2ML IJ SOLN
INTRAMUSCULAR | Status: AC
  Filled 2022-06-11: qty 2

## 2022-06-11 MED ORDER — WARFARIN SODIUM 2.5 MG PO TABS
1.2500 mg | ORAL_TABLET | ORAL | Status: DC
  Administered 2022-06-12: 1.25 mg via ORAL
  Filled 2022-06-11: qty 1
  Filled 2022-06-11: qty 0.5

## 2022-06-11 MED ORDER — SODIUM CHLORIDE 0.9 % IV SOLN
INTRAVENOUS | Status: DC
Start: 2022-06-11 — End: 2022-06-11

## 2022-06-11 MED ORDER — MIDAZOLAM HCL 2 MG/2ML IJ SOLN
INTRAMUSCULAR | Status: AC
  Filled 2022-06-11: qty 2

## 2022-06-11 MED ORDER — SODIUM CHLORIDE 0.9% FLUSH
3.0000 mL | Freq: Two times a day (BID) | INTRAVENOUS | Status: DC
Start: 2022-06-11 — End: 2022-06-13
  Administered 2022-06-11 – 2022-06-12 (×3): 3 mL via INTRAVENOUS

## 2022-06-11 MED ORDER — ACETAMINOPHEN 325 MG PO TABS: 650.0000 mg | ORAL_TABLET | ORAL | Status: DC | PRN

## 2022-06-11 MED ORDER — WARFARIN SODIUM 2.5 MG PO TABS
2.5000 mg | ORAL_TABLET | ORAL | Status: DC
  Administered 2022-06-11: 2.5 mg via ORAL
  Filled 2022-06-11: qty 1

## 2022-06-11 MED ORDER — HEPARIN SODIUM (PORCINE) 1000 UNIT/ML IJ SOLN
INTRAMUSCULAR | Status: DC | PRN
  Administered 2022-06-11: 5000 [IU] via INTRAVENOUS

## 2022-06-11 MED ORDER — LIDOCAINE HCL (PF) 1 % IJ SOLN
INTRAMUSCULAR | Status: DC | PRN
  Administered 2022-06-11: 20 mL

## 2022-06-11 SURGICAL SUPPLY — 17 items
DILATOR VESSEL 38 20CM 12FR (INTRODUCER) ×1 IMPLANT
DILATOR VESSEL 38 20CM 14FR (INTRODUCER) ×1 IMPLANT
DILATOR VESSEL 38 20CM 18FR (INTRODUCER) ×1 IMPLANT
DILATOR VESSEL 38 20CM 8FR (INTRODUCER) ×1 IMPLANT
KIT SYRINGE INJ CVI SPIKEX1 (MISCELLANEOUS) ×1 IMPLANT
MICRA AV TRANSCATH PACING SYS (Pacemaker) ×2 IMPLANT
MICRA INTRODUCER SHEATH (SHEATH) ×2
NDL PERC 18GX7CM (NEEDLE) IMPLANT
NEEDLE PERC 18GX7CM (NEEDLE) ×2 IMPLANT
PACK CARDIAC CATH (CUSTOM PROCEDURE TRAY) ×2 IMPLANT
PROTECTION STATION PRESSURIZED (MISCELLANEOUS) ×2
SHEATH AVANTI 7FRX11 (SHEATH) ×1 IMPLANT
SHEATH INTRODUCER MICRA (SHEATH) IMPLANT
STATION PROTECTION PRESSURIZED (MISCELLANEOUS) IMPLANT
SUT SILK 0 FSL (SUTURE) ×1 IMPLANT
SYSTEM PACING TRNSCTH AV MICRA (Pacemaker) IMPLANT
WIRE AMPLATZ SS-J .035X180CM (WIRE) ×1 IMPLANT

## 2022-06-11 NOTE — Progress Notes (Signed)
Per RN, pt has 2 working PIV's, and will not need additional line. RN will clarify order with MD, since pt is for 7 day course of antibiotic. Recommended PICC line. RN verbalized understanding. Floor RN to d/c midline order.

## 2022-06-11 NOTE — Progress Notes (Signed)
Spoke with ICU RN, made aware PICC will not be placed tonight 7/31. Has adequate vascular access. Plan to place PICC 8/1 for home discharge.

## 2022-06-11 NOTE — Progress Notes (Signed)
[employer] rep. Joanie interrogated pacer . Lower limit set to 50 now & ok.'d by Dr. Darrold Junker via telephone. Pt. Stabel for tx back to ICU.

## 2022-06-11 NOTE — Progress Notes (Signed)
Pt arrived back to ICU 13 from Cath lab. Patient drowsy but easily awakens to voice and answers all questions appropriately. Patient educated on keeping [facility] lower than 30 degrees and keeping RLE straight. Right femoral site assessed with Corey Skains, RN. No complications.

## 2022-06-11 NOTE — Consult Note (Signed)
ANTICOAGULATION CONSULT NOTE  Pharmacy Consult for IV Heparin Indication:  Peri-procedural bridging for history of mechanical mitral valve on warfarin  Patient Measurements: Height: 5\' 6"  (167.6 cm) Weight: 78.5 kg (173 lb 1 oz) IBW/kg (Calculated) : 63.8 Heparin Dosing Weight: 78.5 kg  Labs: Recent Labs    06/08/22 0732 06/08/22 2037 06/09/22 0456 06/10/22 0459 06/11/22 0433  HGB 10.7*  --  10.3* 10.2*  --   HCT 32.6*  --  30.9* 30.7*  --   PLT 137*  --  144* 150  --   LABPROT 13.4  --  15.7* 15.7* 16.9*  INR 1.0  --  1.3* 1.3* 1.4*  HEPARINUNFRC 0.16*   < > 0.37 0.43 0.37  CREATININE 1.03  1.00  --   --   --   --    < > = values in this interval not displayed.     Estimated Creatinine Clearance: 45.5 mL/min (by C-G formula based on SCr of 1 mg/dL).   Medications:  Warfarin 1.25 mg on Tu and 2.5 mg every other day (TWD 16.25 mg)   Assessment: [name] with medical history including diastolic CHF, HTN, BPH, urinary retention / self catheterization, mechanical mitral valve replacement who is admitted with sepsis secondary to presumed UTI complicated by bacteremia and complete heart block s/p temporary pacemaker insertion on 7/26. Warfarin was reversed prior to procedure and is now subtherapeutic. Further instructions to hold warfarin pending anticipated permanent pacemaker placement on 7/31. Patient will also likely need to undergo TEE to rule out endocarditis. Plan is to keep patient on heparin for now. Pharmacy consulted to initiate and manage IV heparin for history of mechanical mitral valve.  Goal of Therapy:  Heparin level 0.3-0.7 units/ml INR 2.5 - 3.5 (when warfarin is resumed) Monitor platelets by anticoagulation protocol: Yes  7/27 2157 HL 0.12; subtherapeutic, 1100 un/hr 7/28 0732 HL 0.16; subtherapeutic, 1350 un/hr 7/28 2037 HL 0.37, therapeutic x1 7/29 0456 HL 0.37, therapeutic x2 7/30 0459 HL 0.43, therapeutic x3 7/31 0433 HL 0.37,  therapeutic x4   Plan:  --Heparin level is therapeutic x 3 --Continue Heparin infusion at 1600 units/hr --Recheck HL on 7/31 with AM labs --Daily CBC per protocol while on IV heparin  8/31 Clinical Pharmacist 06/11/2022 7:04 AM

## 2022-06-11 NOTE — Consult Note (Signed)
ANTICOAGULATION CONSULT NOTE  Pharmacy Consult for IV Heparin transition to Lovenox and Warfarin Management Indication:  Peri-procedural bridging for history of mechanical mitral valve on warfarin  Patient Measurements: Height: 5\' 6"  (167.6 cm) Weight: 78.5 kg (173 lb 1 oz) IBW/kg (Calculated) : 63.8 Heparin Dosing Weight: 78.5 kg  Labs: Recent Labs    06/09/22 0456 06/10/22 0459 06/11/22 0433  HGB 10.3* 10.2*  --   HCT 30.9* 30.7*  --   PLT 144* 150  --   LABPROT 15.7* 15.7* 16.9*  INR 1.3* 1.3* 1.4*  HEPARINUNFRC 0.37 0.43 0.37    Estimated Creatinine Clearance: 45.5 mL/min (by C-G formula based on SCr of 1 mg/dL).  Medications:  Warfarin 1.25 mg on Tu and 2.5 mg every other day (TWD 16.25 mg)   Assessment: [name] with medical history including diastolic CHF, HTN, BPH, urinary retention / self catheterization, mechanical mitral valve replacement who is admitted with sepsis secondary to presumed UTI complicated by bacteremia and complete heart block s/p temporary pacemaker insertion on 7/26. Warfarin was reversed prior to procedure and is now subtherapeutic. Further instructions to hold warfarin pending anticipated permanent pacemaker placement on 7/31. Patient will also likely need to undergo TEE to rule out endocarditis. Plan is to keep patient on heparin for now. Pharmacy consulted to initiate and manage IV heparin for history of mechanical mitral valve.  TEE negative 7/28. PPM procedure completed 7/31  Goal of Therapy:  Heparin level 0.3-0.7 units/ml INR 2.5 - 3.5 (when warfarin is resumed) Monitor platelets by anticoagulation protocol: Yes  7/27 2157 HL 0.12; subtherapeutic, 1100 un/hr 7/28 0732 HL 0.16; subtherapeutic, 1350 un/hr 7/28 2037 HL 0.37, therapeutic x1 7/29 0456 HL 0.37, therapeutic x2 7/30 0459 HL 0.43, therapeutic x3 7/31 0433 HL 0.37, therapeutic x4   Plan:  --Continue heparin at 1600 units/hr until 8/1 at 0959 --Start Lovenox 77.5  mg (1 mg/kg) q12h on 8/1 at 1000 --Re-start warfarin at home dose of 2.5 mg tonight --Plan is to discharge patient on Lovenox bridge; recommend 5 day bridge --Daily INR / CBC  8/31 06/11/2022 12:13 PM

## 2022-06-11 NOTE — Progress Notes (Signed)
Per order, femoral site dressing removed and band-aid placed. Suture intact. Site clean, dry, and intact.

## 2022-06-11 NOTE — Progress Notes (Signed)
Progress Note   Patient: Dean Cruz DDU:[id number] DOB: October 01, 1927 DOA: 06/05/2022     6 DOS: the patient was seen and examined on 06/11/2022   Brief hospital course: Taken from H&P.  Dean Cruz is a 88 y.o. male with medical history significant for chronic diastolic dysfunction CHF, hypertension, BPH with history of urinary retention and self-catheterization twice daily 6 at home who was brought into the ER by EMS for evaluation of generalized weakness. Patient states that he woke up on the morning of his admission and was trying to put his socks on when he slid out off his chair landing on the floor.  He was too weak to get up and so he sat on the floor for about an hour until his granddaughter came home and found him.  She called EMS and he was found to be febrile with a Tmax of 101.5 and hypotensive with a blood pressure of 97/50. He states that he was nauseous in route to the hospital and had an episode of emesis in the ambulance.  His oral intake has been poor over the last several days and he complains of feeling very weak but denies any dizziness or lightheadedness. He denies having any chest pain, no shortness of breath, no headache, no changes in his bowel habits, no leg swelling, no blurred vision or focal deficit. He had a recent cat bite involving his left leg and has some redness over the left anterior leg.   Per EMS patient had room air pulse oximetry of 90% and was placed on 2 L of oxygen.  Patient met sepsis criteria with fever and tachypnea.  Mild lactic acidosis at 2.1, UA with significant pyuria, bacteriuria and hematuria.  Blood cultures with gram-negative diplococci.  Creatinine of 1.5 with unknown baseline as his care was in [place]. Patient received cefepime and vancomycin in ED and was continued on cefepime.  7/26: Patient overnight developed symptomatic bradycardia requiring one-time dose of atropine.  This morning he was still feeling very lethargic, EKG was  obtained due to heart rate being in high 80s to low 40s and found to have complete heart block.  Per patient his normal heart rate is normal in 60s to 70s.  Cardiology was consulted and a temporary percutaneous pacemaker was placed.  He was also given vitamin K for supratherapeutic INR and will need a pacemaker once INR normalizes. ID was also consulted for concern of Pasteurella bacteremia.  Patient's son wants him to be transferred to [place]. will not be safe at this time but we will contact them to see if that is a possibility.  7/27: Patient remained stable, continued to feel very weak.  Temporary pacemaker in place and a permanent pacemaker will be placed on Monday.  Will be going for TEE tomorrow to rule out endocarditis.  Blood cultures with Pasteurella multocida, pending susceptibility. Antibiotics switched to Zosyn by ID.  7/28: TEE was negative for endocarditis.  Temporary pacemaker will be placed on Monday.  Repeat blood cultures ordered today.  Urine cultures with Staph epidermidis, which is most likely a contaminant.  7/29: Repeat blood cultures done yesterday, 7/28 are negative in 24 hours.  Permanent pacemaker will be placed on Monday.  7/30: Hemodynamically stable.  Repeat blood cultures from 7/28 remain negative.  Permanent pacemaker to be placed on Monday.  Rest of the labs stable.  7/31: Permanent pacemaker was placed by cardiology this morning, patient tolerated the procedure well.  Remained stable.  Awaiting final ID recommendations for  antibiotics. Patient will need Lovenox bridge with Coumadin on discharge. Per ID will need 1 more week of IV antibiotics.  Midline ordered   Assessment and Plan: * Sepsis  As evidenced by fever with a Tmax of 101.6, relative hypotension with systolic blood pressure in the 90s, lactic acidosis and pyuria. Patient has a history of BPH with urinary retention and does self-catheterization twice a day.  No urinary symptoms. Blood cultures positive  for Pasteurella, pending susceptibility.   History of cat bite on the left anterior shin few days ago. Patient also has an history of mechanical heart valve. ID consulted for concern of Pasteurella bacteremia.  Antibiotics were switched to Zosyn followed by another switch to Unasyn Repeat blood cultures negative. TEE was negative for endocarditis -Continue Unasyn-Will need 1 more week of antibiotics. -Midline ordered   Complete heart block (HCC) S/p permanent pacemaker placement today -Appreciate cardiology recommendations. -Continue to monitor -Lovenox bridge with Coumadin starting from tomorrow  Hypertension Blood pressure within goal after restarting home amlodipine -Continue home amlodipine Continue holding  lisinopril and metoprolol   urinary retention Patient has a known history of BPH and was noted to have urinary retention upon arrival to the ER.  History of self-catheterization. Imaging showed markedly distended urinary bladder and a Foley catheter was placed in the ER -Continue Flomax and finasteride  Hypothyroidism Stable Continue Synthroid  H/O heart valve replacement with mechanical valve Patient has a history of mechanical heart valve (Aortic) and is on Coumadin.Marland Kitchen Received 1 dose of IV vitamin K to reverse for cardiac procedure INR at 1.3 today -Heparin infusion until permanent pacemaker placed on Monday   Subjective: Patient was seen and examined after getting permanent pacemaker.  Stating that he was trying to wake up.  Denies any pain, still feeling little tired after the anesthesia.  Wants to go home.  Physical Exam: Vitals:   06/11/22 1200 06/11/22 1300 06/11/22 1400 06/11/22 1500  BP: (!) 141/54 (!) 142/80 (!) 118/48 119/60  Pulse: (!) 51 63 (!) 56 (!) 58  Resp: _0 Temp: 97.8 F (36.6 C)     TempSrc: Oral     SpO2: 96% 98% 94% 97%  Weight:      Height:       General.  Frail elderly man, in no acute distress. Pulmonary.  Lungs clear  bilaterally, normal respiratory effort. CV.  Regular rate and rhythm, no JVD, rub or murmur. Abdomen.  Soft, nontender, nondistended, BS positive. CNS.  Alert and oriented .  No focal neurologic deficit. Extremities.  No edema, no cyanosis, pulses intact and symmetrical. Psychiatry.  Judgment and insight appears normal.  Data Reviewed: Prior data reviewed.  Family Communication: Called son with no response today  Disposition: Status is: Inpatient Remains inpatient appropriate because: Severity of illness   Planned Discharge Destination: Home with Home Health  DVT prophylaxis.  Coumadin Time spent: 45 minutes  This record has been created using Systems analyst. Errors have been sought and corrected,but may not always be located. Such creation errors do not reflect on the standard of care.  Author: Lorella Nimrod, MD 06/11/2022 3:33 PM  For on call review [email or web address].

## 2022-06-11 NOTE — Discharge Instructions (Signed)
Please avoid showering/submerging yourself in water for the next week. If your bandage gets wet or starts to fall off, replace it with another piece of gauze and the Tegaderm (clear bandage I provided). You should try to keep it covered for a week. You will follow up with Dr. Welton Flakes in the office in about 1 week. If you have bleeding from the site, lie down and apply firm pressure for 20 minutes, if it continues to bleed, call Dr. Milta Deiters office or present to the ER. Avoid heavy lifting, squatting (other than sitting) and strenuous activity for 1 week. You will get a call from [employer] to set up your pacemaker and the [employer] device. You do not need to bring this device with you to appointments. It is used to monitor your pacemaker from home.

## 2022-06-11 NOTE — Progress Notes (Signed)
ID Pt feeling better today No specific complaints Had pacemaker placed today  BP (!) 141/54 (BP Location: Right Arm)   Pulse (!) 51   Temp 97.8 F (36.6 C) (Oral)   Resp 16   Ht 5\' 6"  (1.676 m)   Wt 78.5 kg   SpO2 96%   BMI 27.93 kg/m   O/e awake and alert Chest b/l air entry Hss1s2- Rt groin -dressing Abd soft Cns non focal Left leg erythema better   Labs Micro-pasteurella bacteremia Repeat blood culture 06/08/22- Ng     Latest Ref Rng & Units 06/10/2022    4:59 AM 06/09/2022    4:56 AM 06/08/2022    7:32 AM  CBC  WBC 4.0 - 10.5 K/uL 7.0  6.3  6.6   Hemoglobin 13.0 - 17.0 g/dL 06/10/2022  77.4  12.8   Hematocrit 39.0 - 52.0 % 30.7  30.9  32.6   Platelets 150 - 400 K/uL 150  144  137        Latest Ref Rng & Units 06/08/2022    7:32 AM 06/06/2022    4:12 AM 06/05/2022   10:51 AM  CMP  Glucose 70 - 99 mg/dL 06/07/2022  98  767   BUN 8 - 23 mg/dL 23  29  32   Creatinine 0.61 - 1.24 mg/dL 209 - 4.70 mg/dL 9.62    8.36  6.29  4.76   Sodium 135 - 145 mmol/L 138  138  138   Potassium 3.5 - 5.1 mmol/L 3.5  3.4  3.9   Chloride 98 - 111 mmol/L 109  108  108   CO2 22 - 32 mmol/L 23  23  21    Calcium 8.9 - 10.3 mg/dL 8.0  8.2  8.7   Total Protein 6.5 - 8.1 g/dL   6.9   Total Bilirubin 0.3 - 1.2 mg/dL   1.1   Alkaline Phos 38 - 126 U/L   66   AST 15 - 41 U/L   21   ALT 0 - 44 U/L   13       Impression/recommendation 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

## 2022-06-11 NOTE — Assessment & Plan Note (Signed)
As evidenced by fever with a Tmax of 101.6, relative hypotension with systolic blood pressure in the 90s, lactic acidosis and pyuria. Patient has a history of BPH with urinary retention and does self-catheterization twice a day.  No urinary symptoms. Blood cultures positive for Pasteurella, pending susceptibility.   History of cat bite on the left anterior shin few days ago. Patient also has an history of mechanical heart valve. ID consulted for concern of Pasteurella bacteremia.  Antibiotics were switched to Zosyn followed by another switch to Unasyn Repeat blood cultures negative. TEE was negative for endocarditis -Continue Unasyn-Will need 1 more week of antibiotics. -Midline ordered

## 2022-06-11 NOTE — Assessment & Plan Note (Signed)
S/p permanent pacemaker placement today -Appreciate cardiology recommendations. -Continue to monitor -Lovenox bridge with Coumadin starting from tomorrow

## 2022-06-12 DIAGNOSIS — R7881 Bacteremia: Secondary | ICD-10-CM | POA: Diagnosis not present

## 2022-06-12 DIAGNOSIS — Z952 Presence of prosthetic heart valve: Secondary | ICD-10-CM | POA: Diagnosis not present

## 2022-06-12 DIAGNOSIS — R338 Other retention of urine: Secondary | ICD-10-CM | POA: Diagnosis not present

## 2022-06-12 DIAGNOSIS — W5501XA Bitten by cat, initial encounter: Secondary | ICD-10-CM | POA: Diagnosis not present

## 2022-06-12 DIAGNOSIS — I442 Atrioventricular block, complete: Secondary | ICD-10-CM | POA: Diagnosis not present

## 2022-06-12 LAB — CBC
HCT: 28.8 % — ABNORMAL LOW (ref 39.0–52.0)
Hemoglobin: 9.6 g/dL — ABNORMAL LOW (ref 13.0–17.0)
MCH: 29 pg (ref 26.0–34.0)
MCHC: 33.3 g/dL (ref 30.0–36.0)
MCV: 87 fL (ref 80.0–100.0)
Platelets: 200 10*3/uL (ref 150–400)
RBC: 3.31 MIL/uL — ABNORMAL LOW (ref 4.22–5.81)
RDW: 14.3 % (ref 11.5–15.5)
WBC: 9.1 10*3/uL (ref 4.0–10.5)
nRBC: 0 % (ref 0.0–0.2)

## 2022-06-12 LAB — HEPARIN LEVEL (UNFRACTIONATED): Heparin Unfractionated: 0.4 IU/mL (ref 0.30–0.70)

## 2022-06-12 LAB — BASIC METABOLIC PANEL
Anion gap: 4 — ABNORMAL LOW (ref 5–15)
BUN: 8 mg/dL (ref 8–23)
CO2: 24 mmol/L (ref 22–32)
Calcium: 8 mg/dL — ABNORMAL LOW (ref 8.9–10.3)
Chloride: 110 mmol/L (ref 98–111)
Creatinine, Ser: 0.91 mg/dL (ref 0.61–1.24)
GFR, Estimated: >60 mL/min (ref >60)
Glucose, Bld: 102 mg/dL — ABNORMAL HIGH (ref 70–99)
Potassium: 3.6 mmol/L (ref 3.5–5.1)
Sodium: 138 mmol/L (ref 135–145)

## 2022-06-12 LAB — PROTIME-INR
INR: 1.4 — ABNORMAL HIGH (ref 0.8–1.2)
Prothrombin Time: 16.6 seconds — ABNORMAL HIGH (ref 11.4–15.2)

## 2022-06-12 MED ORDER — SODIUM CHLORIDE 0.9% FLUSH
10.0000 mL | Freq: Two times a day (BID) | INTRAVENOUS | Status: DC
  Administered 2022-06-12 – 2022-06-13 (×2): 10 mL

## 2022-06-12 MED ORDER — SODIUM CHLORIDE 0.9% FLUSH: 10.0000 mL | INTRAVENOUS | Status: DC | PRN

## 2022-06-12 NOTE — TOC Progression Note (Signed)
Transition of Care [facility]) - Progression Note    Patient Details  Name: Dean Cruz MRN: [id number] Date of Birth: [birth year]  Transition of Care [facility] CM/SW Contact  Allayne Butcher, RN Phone Number: 06/12/2022, 2:45 PM  Clinical Narrative:    Patient needs 2 weeks of IV antibiotics at home.  [facility] for [facility], Feliberto Gottron with [facility] accepted referral for RN.  Patient is a [facility] patient.  Pam will start working on [employer].  PICC line placed today.  Hopefully everything will come together for patient to be able to discharge tomorrow home.     Expected Discharge Plan: Home w Home Health Services Barriers to Discharge: Continued Medical Work up  Expected Discharge Plan and Services Expected Discharge Plan: Home w Home Health Services   Discharge Planning Services: CM Consult Post Acute Care Choice: Home Health Living arrangements for the past 2 months: Single Family Home                 DME Arranged: N/A         HH Arranged: RN, IV Antibiotics HH Agency: [facility] Date HH Agency Contacted: 06/12/22 Time HH Agency Contacted: 1444 Representative spoke with at [facility] Agency: Feliberto Gottron   Social Determinants of Health (SDOH) Interventions    Readmission Risk Interventions     No data to display

## 2022-06-12 NOTE — Treatment Plan (Signed)
Diagnosis: Pasteurella bacteremia Cr < 1  No Known Allergies  OPAT Orders Unasyn 3 grams IV every 6 hours or as a continuous infusion of 12 grams over 24 hours  Duration: 2 weeks End Date: 06/19/22  Midline care Per Protocol:  Labs weekly while on IV antibiotics: _X_ CBC with differential  _X_ CMP   _X_ Please pull midline at completion of IV antibiotics  Fax weekly lab results  promptly to [phone]  Clinic Follow Up Appt: PRN   Call [phone] with any questions

## 2022-06-12 NOTE — Consult Note (Addendum)
ANTICOAGULATION CONSULT NOTE  Pharmacy Consult for Lovenox and Warfarin Management Indication:  Peri-procedural bridging for history of mechanical mitral valve on warfarin  Patient Measurements: Height: 5\' 6"  (167.6 cm) Weight: 78.5 kg (173 lb 1 oz) IBW/kg (Calculated) : 63.8  Labs: Recent Labs    06/10/22 0459 06/11/22 0433 06/12/22 0609  HGB 10.2*  --  9.6*  HCT 30.7*  --  28.8*  PLT 150  --  200  LABPROT 15.7* 16.9* 16.6*  INR 1.3* 1.4* 1.4*  HEPARINUNFRC 0.43 0.37 0.40  CREATININE  --   --  0.91    Estimated Creatinine Clearance: 50 mL/min (by C-G formula based on SCr of 0.91 mg/dL).  Medications:  Warfarin 1.25 mg on Tu and 2.5 mg every other day (TWD 16.25 mg)   Assessment: [name] with medical history including diastolic CHF, HTN, BPH, urinary retention / self catheterization, mechanical mitral valve replacement who is admitted with sepsis secondary to presumed UTI complicated by bacteremia and complete heart block s/p temporary pacemaker insertion on 7/26. Warfarin was reversed prior to procedure and is now subtherapeutic. Further instructions to hold warfarin pending anticipated permanent pacemaker placement on 7/31. Patient will also likely need to undergo TEE to rule out endocarditis. Plan is to keep patient on heparin for now. Pharmacy consulted to initiate and manage IV heparin for history of mechanical mitral valve.  TEE negative 7/28. PPM procedure completed 7/31  Date INR Plan Comments  7/25 3.8 1 mg   7/26 3.8 >> 1.9 Hold IV vitamin K 10 mg  7/27 1.3 Hold IV heparin started   7/28 1 Hold   7/29 1.3 Hold   7/30 1.3 Hold   7/31 1.4 2.5 mg   8/1 1.4 1.25 mg IV heparin stopped Therapeutic LMWH started   Goal of Therapy:  INR 2.5 - 3.5   Plan:  --Continue Lovenox 77.5 mg (1 mg/kg) q12h --INR remains subtherapeutic as expected, continue warfarin per home dosing regimen. Approximate five days before warfarin therapeutic again --Plan is to  discharge patient on Lovenox bridge; recommend 5 day bridge --Daily INR / CBC  8/31 06/12/2022 11:18 AM

## 2022-06-12 NOTE — Progress Notes (Signed)
PHARMACY CONSULT NOTE FOR:  OUTPATIENT  PARENTERAL ANTIBIOTIC THERAPY (OPAT)  Indication: Pasteurella bacteremia Regimen: ampicillin/sulbactam 3gm IV q6h End date: 06/19/2022  Labs - Once weekly:  CBC/D and CMP Please pull midline at completion of IV antibiotics Fax weekly lab results  promptly to [phone]  IV antibiotic discharge orders are pended. To discharging provider:  please sign these orders via discharge navigator,  Select New Orders & click on the button choice - Manage This Unsigned Work.     Thank you for allowing pharmacy to be a part of this patient's care.  Juliette Alcide, PharmD, BCPS, BCIDP Work Cell: [phone] 06/12/2022 2:18 PM

## 2022-06-12 NOTE — Progress Notes (Signed)
SUBJECTIVE: Dean Cruz is a 88 y.o. male with medical history significant for chronic diastolic dysfunction, CHF, hypertension, BPH with history of urinary retention and self-catheterization twice daily at home who was brought into the ER by EMS for evaluation of generalized weakness. Met criteria for sepsis. Found to be in CHB.    Temporary pacemaker placed 7/27 for complete heart block.   TEE 7/28 negative for endocarditis.   MDT Micra placed 06/11/22.   Vitals:   06/12/22 0300 06/12/22 0400 06/12/22 0500 06/12/22 0800  BP: (!) 115/54 137/67 (!) 118/59 (!) 99/56  Pulse: (!) 51 (!) 56 (!) 54 (!) 53  Resp: 16 17 19 17  Temp:      TempSrc:      SpO2: 96% 100% 98% 94%  Weight:      Height:        Intake/Output Summary (Last 24 hours) at 06/12/2022 0841 Last data filed at 06/12/2022 0600 Gross per 24 hour  Intake 1684.64 ml  Output 1825 ml  Net -140.36 ml    LABS: Basic Metabolic Panel: Recent Labs    06/12/22 0609  NA 138  K 3.6  CL 110  CO2 24  GLUCOSE 102*  BUN 8  CREATININE 0.91  CALCIUM 8.0*   Liver Function Tests: No results for input(s): "AST", "ALT", "ALKPHOS", "BILITOT", "PROT", "ALBUMIN" in the last 72 hours. No results for input(s): "LIPASE", "AMYLASE" in the last 72 hours. CBC: Recent Labs    06/10/22 0459 06/12/22 0609  WBC 7.0 9.1  HGB 10.2* 9.6*  HCT 30.7* 28.8*  MCV 87.0 87.0  PLT 150 200   Cardiac Enzymes: No results for input(s): "CKTOTAL", "CKMB", "CKMBINDEX", "TROPONINI" in the last 72 hours. BNP: Invalid input(s): "POCBNP" D-Dimer: No results for input(s): "DDIMER" in the last 72 hours. Hemoglobin A1C: No results for input(s): "HGBA1C" in the last 72 hours. Fasting Lipid Panel: No results for input(s): "CHOL", "HDL", "LDLCALC", "TRIG", "CHOLHDL", "LDLDIRECT" in the last 72 hours. Thyroid Function Tests: No results for input(s): "TSH", "T4TOTAL", "T3FREE", "THYROIDAB" in the last 72 hours.  Invalid input(s): "FREET3" Anemia  Panel: No results for input(s): "VITAMINB12", "FOLATE", "FERRITIN", "TIBC", "IRON", "RETICCTPCT" in the last 72 hours.   PHYSICAL EXAM General: Well developed, well nourished, in no acute distress HEENT:  Normocephalic and atramatic Neck:  No JVD.  Lungs: Clear bilaterally to auscultation and percussion. Heart: HRRR . Normal S1 and S2 without gallops or murmurs.  Abdomen: Bowel sounds are positive, abdomen soft and non-tender  Msk:  Back normal, normal gait. Normal strength and tone for age. Extremities: No clubbing, cyanosis or edema.   Neuro: Alert and oriented X 3. Psych:  Good affect, responds appropriately  TELEMETRY: sinus rhythm, HR 58 bpm  ASSESSMENT AND PLAN: Patient doing well. Continues to feel better. No acute complaints. Received Micra implant 06/11/22. No changes at this time. May follow up in office Monday, 06/18/22 at 11:30 am. [facility]   Principal Problem:   Sepsis  Active Problems:   Hypertension    urinary retention   Hypothyroidism   H/O heart valve replacement with mechanical valve   Complete heart block (HCC)    Amber Scoggins, FNP-C 06/12/2022 8:41 AM        

## 2022-06-12 NOTE — Consult Note (Signed)
ANTICOAGULATION CONSULT NOTE  Pharmacy Consult for IV Heparin transition to Lovenox and Warfarin Management Indication:  Peri-procedural bridging for history of mechanical mitral valve on warfarin  Patient Measurements: Height: 5\' 6"  (167.6 cm) Weight: 78.5 kg (173 lb 1 oz) IBW/kg (Calculated) : 63.8 Heparin Dosing Weight: 78.5 kg  Labs: Recent Labs    06/10/22 0459 06/11/22 0433 06/12/22 0609  HGB 10.2*  --  9.6*  HCT 30.7*  --  28.8*  PLT 150  --  200  LABPROT 15.7* 16.9* 16.6*  INR 1.3* 1.4* 1.4*  HEPARINUNFRC 0.43 0.37 0.40  CREATININE  --   --  0.91    Estimated Creatinine Clearance: 50 mL/min (by C-G formula based on SCr of 0.91 mg/dL).  Medications:  Warfarin 1.25 mg on Tu and 2.5 mg every other day (TWD 16.25 mg)   Assessment: [name] with medical history including diastolic CHF, HTN, BPH, urinary retention / self catheterization, mechanical mitral valve replacement who is admitted with sepsis secondary to presumed UTI complicated by bacteremia and complete heart block s/p temporary pacemaker insertion on 7/26. Warfarin was reversed prior to procedure and is now subtherapeutic. Further instructions to hold warfarin pending anticipated permanent pacemaker placement on 7/31. Patient will also likely need to undergo TEE to rule out endocarditis. Plan is to keep patient on heparin for now. Pharmacy consulted to initiate and manage IV heparin for history of mechanical mitral valve.  TEE negative 7/28. PPM procedure completed 7/31  Goal of Therapy:  Heparin level 0.3-0.7 units/ml INR 2.5 - 3.5 (when warfarin is resumed) Monitor platelets by anticoagulation protocol: Yes  7/27 2157 HL 0.12; subtherapeutic, 1100 un/hr 7/28 0732 HL 0.16; subtherapeutic, 1350 un/hr 7/28 2037 HL 0.37, therapeutic x1 7/29 0456 HL 0.37, therapeutic x2 7/30 0459 HL 0.43, therapeutic x3 7/31 0433 HL 0.37, therapeutic x4 8/1   0609 HL 0.40, therapeutic X 5    Plan:  8/1:  HL @  0609 = 0.4, therapeutic X 5 Will continue pt on current rate and recheck HL on 8/2 with AM labs.   Sharonann Malbrough D 06/12/2022 7:10 AM

## 2022-06-12 NOTE — Progress Notes (Signed)
Peripherally Inserted Central Catheter Placement  The IV Nurse has discussed with the patient and/or persons authorized to consent for the patient, the purpose of this procedure and the potential benefits and risks involved with this procedure.  The benefits include less needle sticks, lab draws from the catheter, and the patient may be discharged home with the catheter. Risks include, but not limited to, infection, bleeding, blood clot (thrombus formation), and puncture of an artery; nerve damage and irregular heartbeat and possibility to perform a PICC exchange if needed/ordered by physician.  Alternatives to this procedure were also discussed.  Bard Power PICC patient education guide, fact sheet on infection prevention and patient information card has been provided to patient /or left at bedside.    PICC Placement Documentation  PICC Single Lumen 06/12/22 Right Basilic 40 cm 0 cm (Active)  Indication for Insertion or Continuance of Line Home intravenous therapies (PICC only) 06/12/22 0956  Exposed Catheter (cm) 0 cm 06/12/22 0956  Site Assessment Clean, Dry, Intact 06/12/22 0956  Line Status Flushed;Saline locked;Blood return noted 06/12/22 0956  Dressing Type Transparent;Securing device 06/12/22 0956  Dressing Status Antimicrobial disc in place;Clean, Dry, Intact 06/12/22 0956  Safety Lock Not Applicable 06/12/22 0956  Line Adjustment (NICU/IV Team Only) No 06/12/22 0956  Dressing Intervention New dressing;Other (Comment) 06/12/22 0956  Dressing Change Due 06/19/22 06/12/22 0956       Annett Fabian 06/12/2022, 9:58 AM

## 2022-06-12 NOTE — Assessment & Plan Note (Signed)
As evidenced by fever with a Tmax of 101.6, relative hypotension with systolic blood pressure in the 90s, lactic acidosis and pyuria. Patient has a history of BPH with urinary retention and does self-catheterization twice a day.  No urinary symptoms. Blood cultures positive for Pasteurella.  History of cat bite on the left anterior shin few days ago. Patient also has an history of mechanical heart valve. ID consulted for concern of Pasteurella bacteremia.  Antibiotics were switched to Zosyn followed by another switch to Unasyn Repeat blood cultures negative. TEE was negative for endocarditis -Continue Unasyn-Will need 1 more week of antibiotics, stop date of 06/19/2022 -PICC line in place

## 2022-06-12 NOTE — Progress Notes (Signed)
Progress Note   Patient: Dean Cruz NKN:[id number] DOB: 1927/10/19 DOA: 06/05/2022     7 DOS: the patient was seen and examined on 06/12/2022   Brief hospital course: Taken from H&P.  Gregori Abril is a 88 y.o. male with medical history significant for chronic diastolic dysfunction CHF, hypertension, BPH with history of urinary retention and self-catheterization twice daily 6 at home who was brought into the ER by EMS for evaluation of generalized weakness. Patient states that he woke up on the morning of his admission and was trying to put his socks on when he slid out off his chair landing on the floor.  He was too weak to get up and so he sat on the floor for about an hour until his granddaughter came home and found him.  She called EMS and he was found to be febrile with a Tmax of 101.5 and hypotensive with a blood pressure of 97/50. He states that he was nauseous in route to the hospital and had an episode of emesis in the ambulance.  His oral intake has been poor over the last several days and he complains of feeling very weak but denies any dizziness or lightheadedness. He denies having any chest pain, no shortness of breath, no headache, no changes in his bowel habits, no leg swelling, no blurred vision or focal deficit. He had a recent cat bite involving his left leg and has some redness over the left anterior leg.   Per EMS patient had room air pulse oximetry of 90% and was placed on 2 L of oxygen.  Patient met sepsis criteria with fever and tachypnea.  Mild lactic acidosis at 2.1, UA with significant pyuria, bacteriuria and hematuria.  Blood cultures with gram-negative diplococci.  Creatinine of 1.5 with unknown baseline as his care was in [place]. Patient received cefepime and vancomycin in ED and was continued on cefepime.  7/26: Patient overnight developed symptomatic bradycardia requiring one-time dose of atropine.  This morning he was still feeling very lethargic, EKG was obtained  due to heart rate being in high 80s to low 40s and found to have complete heart block.  Per patient his normal heart rate is normal in 60s to 70s.  Cardiology was consulted and a temporary percutaneous pacemaker was placed.  He was also given vitamin K for supratherapeutic INR and will need a pacemaker once INR normalizes. ID was also consulted for concern of Pasteurella bacteremia.  Patient's son wants him to be transferred to [place]. will not be safe at this time but we will contact them to see if that is a possibility.  7/27: Patient remained stable, continued to feel very weak.  Temporary pacemaker in place and a permanent pacemaker will be placed on Monday.  Will be going for TEE tomorrow to rule out endocarditis.  Blood cultures with Pasteurella multocida, pending susceptibility. Antibiotics switched to Zosyn by ID.  7/28: TEE was negative for endocarditis.  Temporary pacemaker will be placed on Monday.  Repeat blood cultures ordered today.  Urine cultures with Staph epidermidis, which is most likely a contaminant.  7/29: Repeat blood cultures done yesterday, 7/28 are negative in 24 hours.  Permanent pacemaker will be placed on Monday.  7/30: Hemodynamically stable.  Repeat blood cultures from 7/28 remain negative.  Permanent pacemaker to be placed on Monday.  Rest of the labs stable.  7/31: Permanent pacemaker was placed by cardiology this morning, patient tolerated the procedure well.  Remained stable.  Awaiting final ID recommendations for  antibiotics. Patient will need Lovenox bridge with Coumadin on discharge. Per ID will need 1 more week of IV antibiotics.    8/1: PICC line was placed yesterday.  Patient need IV antibiotics until 06/19/2022.  Outpatient IV team and TOC is working on getting approval from the [place] that can take 1 to 3 days.  Medically stable and appears to be at his baseline.   Assessment and Plan: * Sepsis  As evidenced by fever with a Tmax of 101.6, relative hypotension  with systolic blood pressure in the 90s, lactic acidosis and pyuria. Patient has a history of BPH with urinary retention and does self-catheterization twice a day.  No urinary symptoms. Blood cultures positive for Pasteurella.  History of cat bite on the left anterior shin few days ago. Patient also has an history of mechanical heart valve. ID consulted for concern of Pasteurella bacteremia.  Antibiotics were switched to Zosyn followed by another switch to Unasyn Repeat blood cultures negative. TEE was negative for endocarditis -Continue Unasyn-Will need 1 more week of antibiotics, stop date of 06/19/2022 -PICC line in place   Complete heart block [facility]) S/p permanent pacemaker placement today -Appreciate cardiology recommendations. -Continue to monitor -Lovenox bridge with Coumadin starting from tomorrow  Hypertension Blood pressure within goal after restarting home amlodipine -Continue home amlodipine Continue holding  lisinopril and metoprolol   urinary retention Patient has a known history of BPH and was noted to have urinary retention upon arrival to the ER.  History of self-catheterization. Imaging showed markedly distended urinary bladder and a Foley catheter was placed in the ER -Continue Flomax and finasteride  Hypothyroidism Stable Continue Synthroid  H/O heart valve replacement with mechanical valve Patient has a history of mechanical heart valve (Aortic) and is on Coumadin.Marland Kitchen Received 1 dose of IV vitamin K to reverse for cardiac procedure INR at 1.3 today -Heparin infusion until permanent pacemaker placed on Monday   Subjective: Patient was seen and examined today.  No new complaints.  He wants to go home but understand that we need his [place] insurance approval before discharge for continuation of IV antibiotics until 06/19/2022.  Physical Exam: Vitals:   06/12/22 0500 06/12/22 0800 06/12/22 0900 06/12/22 1244  BP: (!) 118/59 (!) 99/56 (!) 135/59 (!) 130/56  Pulse:  (!) 54 (!) 53 (!) 51   Resp: _0 Temp:    98 F (36.7 C)  TempSrc:      SpO2: 98% 94% 98% 97%  Weight:      Height:       General. Frail elderly man, In no acute distress. Pulmonary.  Lungs clear bilaterally, normal respiratory effort. CV.  Regular rate and rhythm, no JVD, rub or murmur. Abdomen.  Soft, nontender, nondistended, BS positive. CNS.  Alert and oriented .  No focal neurologic deficit. Extremities.  No edema, no cyanosis, pulses intact and symmetrical. Psychiatry.  Judgment and insight appears normal.  Data Reviewed: Prior data reviewed.  Family Communication: Discussed with son on phone.  Disposition: Status is: Inpatient Remains inpatient appropriate because: Severity of illness.   Planned Discharge Destination: Home with Home Health  DVT Prophylaxis. Coumadin Time spent: 40 minutes  This record has been created using Systems analyst. Errors have been sought and corrected,but may not always be located. Such creation errors do not reflect on the standard of care.  Author: Lorella Nimrod, MD 06/12/2022 2:16 PM  For on call review [email or web address].

## 2022-06-12 NOTE — Progress Notes (Addendum)
ID Pt out of ICU Doing very well Daughter at bedside No sob No fever  O/e awake and alert .BP (!) 115/50 (BP Location: Left Arm)   Pulse (!) 54   Temp 98.3 F (36.8 C) (Oral)   Resp 16   Ht 5\' 6"  (1.676 m)   Wt 78.5 kg   SpO2 94%   BMI 27.93 kg/m   Chest b/l air entry [id number] Abd soft Left leg venous pigmentation No cellulitis CNS non focal   Labs    Latest Ref Rng & Units 06/12/2022    6:09 AM 06/10/2022    4:59 AM 06/09/2022    4:56 AM  CBC  WBC 4.0 - 10.5 K/uL 9.1  7.0  6.3   Hemoglobin 13.0 - 17.0 g/dL 9.6  06/11/2022  44.0   Hematocrit 39.0 - 52.0 % 28.8  30.7  30.9   Platelets 150 - 400 K/uL 200  150  144        Latest Ref Rng & Units 06/12/2022    6:09 AM 06/08/2022    7:32 AM 06/06/2022    4:12 AM  CMP  Glucose 70 - 99 mg/dL 06/08/2022  725  98   BUN 8 - 23 mg/dL 8  23  29    Creatinine 0.61 - 1.24 mg/dL 366     4.40  3.47   Sodium 135 - 145 mmol/L 138  138  138   Potassium 3.5 - 5.1 mmol/L 3.6  3.5  3.4   Chloride 98 - 111 mmol/L 110  109  108   CO2 22 - 32 mmol/L 24  23  23    Calcium 8.9 - 10.3 mg/dL 8.0  8.0  8.2      Micro 06/05/22 pasteurella 06/08/22-NG  Impression/recommendation  Pasteurella bacteremia from cat bite- doing better- repeat culture neg . TEE negative because of recent pacemaker placement will continue 2 weeks of IV unasyn until 06/19/22  Told his daughter that this  bacteria is a common organism of mouth flora of the cat and it does not mean the cat has infection and hence it is not necessary to treat the cat with antibiotics  H/o mitral valve replacement  Complete heart block- now has Micra AV leadless pacemaker implant  Urinary retention - now has a foley 'at home he usually self catheterize   Discussed the management with patient and daughter ID will sign off- call if needed  Diagnosis: Pasteurella bacteremia Cr < 1   No Known Allergies   OPAT Orders Unasyn 3 grams IV every 6 hours or as a continuous infusion of 12 grams  over 24 hours   Duration: 2 weeks End Date: 06/19/22   Midline care Per Protocol:   Labs weekly while on IV antibiotics: _X_ CBC with differential   _X_ CMP     _X_ Please pull midline at completion of IV antibiotics   Fax weekly lab results  promptly to [phone]   Clinic Follow Up Appt: PRN     Call [phone] with any questions

## 2022-06-13 LAB — CBC
HCT: 29.5 % — ABNORMAL LOW (ref 39.0–52.0)
Hemoglobin: 9.5 g/dL — ABNORMAL LOW (ref 13.0–17.0)
MCH: 28.1 pg (ref 26.0–34.0)
MCHC: 32.2 g/dL (ref 30.0–36.0)
MCV: 87.3 fL (ref 80.0–100.0)
Platelets: 223 10*3/uL (ref 150–400)
RBC: 3.38 MIL/uL — ABNORMAL LOW (ref 4.22–5.81)
RDW: 14.2 % (ref 11.5–15.5)
WBC: 9 10*3/uL (ref 4.0–10.5)
nRBC: 0 % (ref 0.0–0.2)

## 2022-06-13 LAB — PROTIME-INR
INR: 1.4 — ABNORMAL HIGH (ref 0.8–1.2)
Prothrombin Time: 17.4 seconds — ABNORMAL HIGH (ref 11.4–15.2)

## 2022-06-13 LAB — CULTURE, BLOOD (ROUTINE X 2)
Culture: NO GROWTH
Culture: NO GROWTH
Special Requests: ADEQUATE

## 2022-06-13 MED ORDER — WARFARIN SODIUM 3 MG PO TABS
3.0000 mg | ORAL_TABLET | Freq: Once | ORAL | Status: DC
  Filled 2022-06-13: qty 1

## 2022-06-13 MED ORDER — ENOXAPARIN SODIUM 80 MG/0.8ML IJ SOSY: 1.0000 mg/kg | PREFILLED_SYRINGE | Freq: Two times a day (BID) | INTRAMUSCULAR | 0 refills | Status: AC

## 2022-06-13 MED ORDER — AMOXICILLIN-POT CLAVULANATE 875-125 MG PO TABS: 1.0000 | ORAL_TABLET | Freq: Two times a day (BID) | ORAL | 0 refills | Status: AC

## 2022-06-13 MED ORDER — LISINOPRIL 40 MG PO TABS: ORAL_TABLET | ORAL | Status: AC

## 2022-06-13 MED ORDER — AMOXICILLIN-POT CLAVULANATE 875-125 MG PO TABS
1.0000 | ORAL_TABLET | Freq: Two times a day (BID) | ORAL | Status: DC
  Administered 2022-06-13: 1 via ORAL
  Filled 2022-06-13: qty 1

## 2022-06-13 NOTE — Discharge Summary (Signed)
Physician Discharge Summary   Radwan Lone  male DOB: 07/21/1927  [id number]  PCP: [facility]  Admit date: 06/05/2022 Discharge date: 06/13/2022  Admitted From: home Disposition:  home CODE STATUS: Full code  Discharge Instructions     Discharge instructions   Complete by: As directed    Please take antibiotic Augmentin every 12 hours, last day 06/19/22, for your blood infection.  Your INR is 1.4 today.  Please take 1.5 tablets of your warfarin this evening 06/13/22, and then resume your regular warfarin dosing starting tomorrow.  Please get your INR checked on Friday 06/15/22.  You need to take Lovenox injection every 12 hours until your INR is therapeutic (your coumadin clinic should let you know when to stop taking Lovenox).  Please hold Lisinopril until followup with your PCP because your blood pressure has been normal without it.   Dr. Enzo Bi - -   No wound care   Complete by: As directed       Hospital Course:  For full details, please see H&P, progress notes, consult notes and ancillary notes.  Briefly,  Mainor Dewit is a 88 y.o. male with medical history significant for chronic diastolic dysfunction CHF, hypertension, BPH with history of urinary retention and self-catheterization who was brought into the ER by EMS for evaluation of generalized weakness.  He had a recent cat bite involving his left leg and had some redness over the left anterior leg.      * Sepsis 2/2 Pasteurella bacteremia from cat bite fever with a Tmax of 101.6, tachypnea. --started on vanc/cefepime, then switched to zosyn, followed by another switch to Unasyn, with ID consult on board. Repeat blood cultures negative. TEE was negative for endocarditis --original plan was for pt to go home on IV abx, however, family did not want pt to go home with PICC and IV abx, so pt was discharged on PO augmentin until 8/823, per ID rec.  Complete heart block [facility]) S/p permanent  pacemaker placement on 06/11/22 7/26: Patient developed symptomatic bradycardia requiring one-time dose of atropine.  EKG found to have complete heart block.  Cardiology was consulted and a temporary percutaneous pacemaker was placed f/b leadless pacemaker insertion.   Hypertension --Pt was not taking metop PTA -Continue home amlodipine --hold Lisinopril until outpatient f/u   urinary retention with self cath Patient has a known history of BPH and was noted to have urinary retention upon arrival to the ER.  Foley catheter was placed in the ER, removed prior to discharge.   --pt to continue self cath at home. 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  Pt was discharged with Lovenox for bridging until INR is therapeutic.    AKI Cr 1.50 on presentation, improved to 0.91 prior to discharge.   Discharge Diagnoses:  Principal Problem:   Sepsis  Active Problems:   Complete heart block (HCC)   Hypertension    urinary retention   Hypothyroidism   H/O heart valve replacement with mechanical valve   30 Day Unplanned Readmission Risk Score    Flowsheet Row ED to Hosp-Admission (Current) from 06/05/2022 in [place] [facility]  30 Day Unplanned Readmission Risk Score (%) 17.6 Filed at 06/13/2022 1200       This score is the patient's risk of an unplanned readmission within  30 days of being discharged (0 -100%). The score is based on dignosis, age, lab data, medications, orders, and past utilization.   Low:  0-14.9   Medium: 15-21.9   High: 22-29.9   Extreme: 30 and above         Discharge Instructions:  Allergies as of 06/13/2022   No Known Allergies      Medication List     STOP taking these medications    metoprolol succinate 25 MG 24 hr tablet Commonly known  as: TOPROL-XL       TAKE these medications    acetaminophen 500 MG tablet Commonly known as: TYLENOL Take 2 tablets by mouth every 6 (six) hours as needed.   amLODipine 10 MG tablet Commonly known as: NORVASC Take 5 mg by mouth daily.   amoxicillin-clavulanate 875-125 MG tablet Commonly known as: AUGMENTIN Take 1 tablet by mouth every 12 (twelve) hours for 7 days.   ascorbic acid 500 MG tablet Commonly known as: VITAMIN C Take 500 mg by mouth 2 (two) times daily. Noon and bedtime   Aspirin 81 MG Caps Take 1 tablet by mouth daily.   enoxaparin 80 MG/0.8ML injection Commonly known as: LOVENOX Inject 0.775 mLs (77.5 mg total) into the skin every 12 (twelve) hours for 7 days.   ferrous sulfate 325 (65 FE) MG EC tablet Take 324 mg by mouth daily with breakfast.   finasteride 5 MG tablet Commonly known as: PROSCAR Take 5 mg by mouth daily.   levothyroxine 100 MCG tablet Commonly known as: SYNTHROID Take 100 mcg by mouth daily before breakfast.   lisinopril 40 MG tablet Commonly known as: ZESTRIL Hold until followup with PCP since your blood pressure has been normal without it in the hospital. What changed:  how much to take how to take this when to take this additional instructions   polyethylene glycol 17 g packet Commonly known as: MIRALAX / GLYCOLAX Take 17 g by mouth daily.   rosuvastatin 40 MG tablet Commonly known as: CRESTOR Take 40 mg by mouth at bedtime.   tamsulosin 0.4 MG Caps capsule Commonly known as: FLOMAX Take 1 capsule by mouth daily.   warfarin 2.5 MG tablet Commonly known as: COUMADIN Take 2.5 mg by mouth daily. Take one tablet by mouth every day, except take 1.25 Tuesday.         Follow-up Information     Dionisio David, MD. Go in 1 week(s).   Specialty: Cardiology Contact information: [place] [address] [place] Follow up in 1 week(s).   Specialty: General  Practice Contact information: [place] [id number] [phone]         Coumadin clinic Follow up on 06/15/2022.   Why: INR check.                No Known Allergies   The results of significant diagnostics from this hospitalization (including imaging, microbiology, ancillary and laboratory) are listed below for reference.   Consultations:   Procedures/Studies: [place] EKG SITE RITE  Result Date: 06/11/2022 If Site Rite image not attached, placement could not be confirmed due to current cardiac rhythm.  EP PPM/ICD IMPLANT  Result Date: 06/11/2022 Successful Micra AV leadless pacemaker implantation   ECHO TEE  Result Date: 06/08/2022    TRANSESOPHOGEAL ECHO REPORT   Patient Name:   JONAHS HECKMAN Date of Exam: 06/08/2022 Medical Rec #:  [id number]  Height:       66.0 in Accession #:    [id number]       Weight:       173.1 lb Date of Birth:  05-17-1927        BSA:          1.881 m Patient Age:    88 years         BP:           171/72 mmHg Patient Gender: M                HR:           69 bpm. Exam Location:  [facility] Procedure: Transesophageal Echo, Cardiac Doppler and Color Doppler Indications:     Not listed on TEE check-in sheet  History:         Patient has prior history of Echocardiogram examinations, most                  recent 06/06/2022. CHF; Risk Factors:Hypertension.  Sonographer:     Sherrie Sport Referring Phys:  [place] Diagnosing Phys: [place]: The transesophogeal probe was passed without difficulty through the esophogus of the patient. Local oropharyngeal anesthetic was provided with Benzocaine spray. Sedation performed by performing physician. The patient's vital signs; including heart rate, blood pressure, and oxygen saturation; remained stable throughout the procedure. The patient developed no complications during the procedure. IMPRESSIONS  1. Left ventricular ejection fraction, by estimation, is 60 to 65%. The left ventricle has  normal function. The left ventricle has no regional wall motion abnormalities.  2. Right ventricular systolic function is normal. The right ventricular size is normal.  3. Left atrial size was moderately dilated. No left atrial/left atrial appendage thrombus was detected.  4. The mitral valve has been repaired/replaced. Mild mitral valve regurgitation. No evidence of mitral stenosis.  5. The aortic valve is calcified. Aortic valve regurgitation is not visualized. Aortic valve sclerosis/calcification is present, without any evidence of aortic stenosis.  6. The inferior vena cava is normal in size with greater than 50% respiratory variability, suggesting right atrial pressure of 3 mmHg. Conclusion(s)/Recommendation(s): Normal biventricular function without evidence of hemodynamically significant valvular heart disease. No evidence of vegetation/infective endocarditis on this transesophageael echocardiogram. FINDINGS  Left Ventricle: Left ventricular ejection fraction, by estimation, is 60 to 65%. The left ventricle has normal function. The left ventricle has no regional wall motion abnormalities. The left ventricular internal cavity size was normal in size. There is  no left ventricular hypertrophy. Right Ventricle: The right ventricular size is normal. No increase in right ventricular wall thickness. Right ventricular systolic function is normal. Left Atrium: Left atrial size was moderately dilated. No left atrial/left atrial appendage thrombus was detected. Right Atrium: Right atrial size was normal in size. Pericardium: There is no evidence of pericardial effusion. Mitral Valve: The mitral valve has been repaired/replaced. Mild mitral valve regurgitation. No evidence of mitral valve stenosis. There is no evidence of mitral valve vegetation. Tricuspid Valve: The tricuspid valve is normal in structure. Tricuspid valve regurgitation is mild . No evidence of tricuspid stenosis. Aortic Valve: The aortic valve is  calcified. Aortic valve regurgitation is not visualized. Aortic valve sclerosis/calcification is present, without any evidence of aortic stenosis. Pulmonic Valve: The pulmonic valve was normal in structure. Pulmonic valve regurgitation is not visualized. No evidence of pulmonic stenosis. Aorta: The aortic root is normal in size and structure. Venous: The inferior vena cava is normal in  size with greater than 50% respiratory variability, suggesting right atrial pressure of 3 mmHg. IAS/Shunts: No atrial level shunt detected by color flow Doppler. Neoma Laming Electronically signed by Neoma Laming Signature Date/Time: 06/08/2022/10:41:08 AM    Final    DG Chest Port 1 View  Result Date: 06/07/2022 CLINICAL DATA:  Chest pain. EXAM: PORTABLE CHEST 1 VIEW COMPARISON:  June 05, 2022 FINDINGS: Multiple sternal wires are noted. A right internal jugular venous catheter is seen with its distal tip seen within the right atrium. This is approximately 6.3 cm distal to the junction of the superior vena cava and right atrium and represents a new finding when compared to the prior study. The heart size and mediastinal contours are within normal limits. An artificial cardiac valve is noted. There is moderate severity prominence of the bilateral perihilar pulmonary vasculature. Mild atelectasis is seen within the bilateral lung bases. There is no evidence of a pleural effusion or pneumothorax. No acute osseous abnormalities are identified. IMPRESSION: 1. Interval right internal jugular venous catheter placement positioning, as described above. 2. Moderate severity pulmonary vascular congestion. 3. Mild bibasilar atelectasis. Electronically Signed   By: Virgina Norfolk M.D.   On: 06/07/2022 00:21   CARDIAC CATHETERIZATION  Result Date: 06/06/2022 Successful placement of transvenous temporary pacemaker via right internal jugular vein   ECHOCARDIOGRAM COMPLETE  Result Date: 06/06/2022    ECHOCARDIOGRAM REPORT   Patient Name:    DELORIAN SABIA Date of Exam: 06/06/2022 Medical Rec #:  [id number]        Height:       68.0 in Accession #:    [id number]       Weight:       183.0 lb Date of Birth:  09-Dec-1926        BSA:          1.968 m Patient Age:    35 years         BP:           103/50 mmHg Patient Gender: M                HR:           41 bpm. Exam Location:  [facility] Procedure: 2D Echo, Color Doppler and Cardiac Doppler Indications:     Endocarditis I38  History:         Patient has prior history of Echocardiogram examinations, most                  recent 03/28/2018. CHF; Risk Factors:Hypertension. History of                  mechanical Mitral valve replacement.  Sonographer:     Sherrie Sport Referring Phys:  [place] Diagnosing Phys: Neoma Laming  Sonographer Comments: Technically challenging study due to limited acoustic windows and suboptimal apical window. IMPRESSIONS  1. Left ventricular ejection fraction, by estimation, is 60 to 65%. The left ventricle has normal function. The left ventricle has no regional wall motion abnormalities. There is moderate left ventricular hypertrophy. Left ventricular diastolic parameters are consistent with Grade I diastolic dysfunction (impaired relaxation).  2. Right ventricular systolic function is mildly reduced. The right ventricular size is moderately enlarged.  3. Left atrial size was mild to moderately dilated.  4. Right atrial size was mild to moderately dilated.  5. The mitral valve has been repaired/replaced. Mild mitral valve regurgitation. No evidence of mitral stenosis. Severe mitral annular calcification.  6. The aortic  valve is calcified. Aortic valve regurgitation is trivial. Mild aortic valve stenosis.  7. The inferior vena cava is normal in size with greater than 50% respiratory variability, suggesting right atrial pressure of 3 mmHg. Comparison(s): Mechanical MVR. Conclusion(s)/Recommendation(s): Valvular findings as outlined below. FINDINGS  Left Ventricle: Left ventricular  ejection fraction, by estimation, is 60 to 65%. The left ventricle has normal function. The left ventricle has no regional wall motion abnormalities. The left ventricular internal cavity size was normal in size. There is  moderate left ventricular hypertrophy. Left ventricular diastolic parameters are consistent with Grade I diastolic dysfunction (impaired relaxation). Right Ventricle: The right ventricular size is moderately enlarged. No increase in right ventricular wall thickness. Right ventricular systolic function is mildly reduced. Left Atrium: Left atrial size was mild to moderately dilated. Right Atrium: Right atrial size was mild to moderately dilated. Pericardium: There is no evidence of pericardial effusion. Mitral Valve: The mitral valve has been repaired/replaced. There is severe thickening of the mitral valve leaflet(s). There is severe calcification of the mitral valve leaflet(s). Moderately decreased mobility of the mitral valve leaflets. Severe mitral annular calcification. Mild mitral valve regurgitation. No evidence of mitral valve stenosis. MV peak gradient, 7.7 mmHg. The mean mitral valve gradient is 3.0 mmHg. Mechanical mitral valve replacement, will need TEE to r/o Endocarditis Tricuspid Valve: The tricuspid valve is normal in structure. Tricuspid valve regurgitation is mild . No evidence of tricuspid stenosis. Aortic Valve: The aortic valve is calcified. Aortic valve regurgitation is trivial. Mild aortic stenosis is present. Aortic valve mean gradient measures 15.0 mmHg. Aortic valve peak gradient measures 30.0 mmHg. Aortic valve area, by VTI measures 1.12 cm. Pulmonic Valve: The pulmonic valve was normal in structure. Pulmonic valve regurgitation is not visualized. No evidence of pulmonic stenosis. Aorta: The aortic root is normal in size and structure. Venous: The inferior vena cava is normal in size with greater than 50% respiratory variability, suggesting right atrial pressure of 3 mmHg.  IAS/Shunts: No atrial level shunt detected by color flow Doppler.  LEFT VENTRICLE PLAX 2D LVIDd:         4.90 cm LVIDs:         3.00 cm LV PW:         1.50 cm LV IVS:        1.20 cm LVOT diam:     2.20 cm LV SV:         61 LV SV Index:   31 LVOT Area:     3.80 cm  RIGHT VENTRICLE RV S prime:     9.79 cm/s TAPSE (M-mode): 2.6 cm LEFT ATRIUM            Index        RIGHT ATRIUM           Index LA diam:      4.20 cm  2.13 cm/m   RA Area:     19.70 cm LA Vol (A2C): 149.0 ml 75.71 ml/m  RA Volume:   58.10 ml  29.52 ml/m LA Vol (A4C): 97.8 ml  49.70 ml/m  AORTIC VALVE AV Area (Vmax):    0.91 cm AV Area (Vmean):   0.98 cm AV Area (VTI):     1.12 cm AV Vmax:           273.67 cm/s AV Vmean:          172.667 cm/s AV VTI:            0.547 m AV Peak Grad:  30.0 mmHg AV Mean Grad:      15.0 mmHg LVOT Vmax:         65.30 cm/s LVOT Vmean:        44.500 cm/s LVOT VTI:          0.161 m LVOT/AV VTI ratio: 0.29  AORTA Ao Root diam: 3.20 cm MITRAL VALVE               TRICUSPID VALVE MV Area (PHT): 1.89 cm    TR Peak grad:   21.5 mmHg MV Area VTI:   1.59 cm    TR Vmax:        232.00 cm/s MV Peak grad:  7.7 mmHg MV Mean grad:  3.0 mmHg    SHUNTS MV Vmax:       1.38 m/s    Systemic VTI:  0.16 m MV Vmean:      78.5 cm/s   Systemic Diam: 2.20 cm MV Decel Time: 402 msec MV E velocity: 97.70 cm/s MV A velocity: 74.10 cm/s MV E/A ratio:  1.32 Shaukat Khan Electronically signed by Neoma Laming Signature Date/Time: 06/06/2022/2:13:03 PM    Final    CT ABDOMEN PELVIS W CONTRAST  Result Date: 06/05/2022 CLINICAL DATA:  Sepsis generalized weakness and fever. EXAM: CT ABDOMEN AND PELVIS WITH CONTRAST TECHNIQUE: Multidetector CT imaging of the abdomen and pelvis was performed using the standard protocol following bolus administration of intravenous contrast. RADIATION DOSE REDUCTION: This exam was performed according to the departmental dose-optimization program which includes automated exposure control, adjustment of the mA and/or  kV according to patient size and/or use of iterative reconstruction technique. CONTRAST:  25mL OMNIPAQUE IOHEXOL 300 MG/ML  SOLN COMPARISON:  None available FINDINGS: Lower chest: Mitral valve replacement. Cardiomegaly. No pericardial effusion. Epicardial pacer wires are in place. Heart is incompletely imaged. Basilar atelectasis. No dense consolidation. No sign of pleural effusion. Post sternotomy. Hepatobiliary: Hepatic cysts. Smooth hepatic contours. Cyst in the LEFT liver and in the RIGHT hemiliver. Portal vein is patent. No pericholecystic stranding. Small gallstone in the dependent gallbladder. No biliary duct dilation. Pancreas: Mild pancreatic atrophy. 2.0 x 1.4 cm area of low attenuation measuring 18 Hounsfield units, cystic appearance in the tail of the pancreas. No peripancreatic stranding. No ductal dilation. Spleen: Normal. Adrenals/Urinary Tract: Adrenal glands are normal. Symmetric renal enhancement without hydronephrosis. No perinephric stranding. High density lesion (but less than adjacent renal cortex in terms of density) arises from the posterior cortex of the LEFT kidney, 84 Hounsfield units on venous phase and 66 Hounsfield units on delayed phase (image 38/2). Otherwise without suspicious renal lesion. No no substantial perinephric stranding. Urinary bladder is markedly distended extending up into the abdomen. No perivesical stranding. No gross bladder wall thickening. Distal ureters with limited assessment along with bladder base due to the RIGHT hip arthroplasty the results in considerable streak artifact about the pelvis. Stomach/Bowel: No acute bowel process. Colonic diverticulosis. Appendix is normal. Stomach under distended. Vascular/Lymphatic: Aortic atherosclerosis both calcified and noncalcified. Mild infrarenal abdominal aortic aneurysm at 3.4 x 3.2 cm. There is no gastrohepatic or hepatoduodenal ligament lymphadenopathy. No retroperitoneal or mesenteric lymphadenopathy. No pelvic  sidewall lymphadenopathy. Reproductive: Grossly unremarkable by CT but area with considerably limited assessment due to this streak artifact from RIGHT hip arthroplasty. Other: No ascites.  No pneumoperitoneum. Musculoskeletal: Degenerative changes in the spine moderate to marked with RIGHT hip arthroplasty. Signs of median sternotomy. IMPRESSION: 1. Markedly distended urinary bladder extending up into the abdomen. Correlate with signs of bladder  outlet obstruction or urinary retention. Urinary bladder extends well into the low abdomen from the pelvis approximately 16 cm greatest axial dimension. Would also correlate with urinalysis in the context of potential bladder outlet obstruction. 2. Posterior cortical lesion on the LEFT suspicious for small solid renal neoplasm in the lower pole of the LEFT kidney. Could consider comparison with prior imaging, if no prior imaging is available would suggest follow-up CT or MRI at 3-6 months with without contrast for further assessment. 3. Cystic pancreatic lesion without associated high-risk features at this time. Consider comparison with prior imaging or follow-up with MRI in 2 years as warranted. 4. Cholelithiasis without evidence of acute cholecystitis. 5. Mild infrarenal abdominal aortic aneurysm at 3.4 x 3.2 cm. 3.4 cm infrarenal abdominal aortic aneurysm. Recommend follow-up every 3 years. Reference: J Am Coll Radiol E031985. 6. Colonic diverticulosis without evidence of acute diverticulitis. 7. Aortic atherosclerosis. Aortic Atherosclerosis (ICD10-I70.0). Electronically Signed   By: Zetta Bills M.D.   On: 06/05/2022 14:19   CT HEAD WO CONTRAST (5MM)  Result Date: 06/05/2022 CLINICAL DATA:  Mental status change, unknown cause EXAM: CT HEAD WITHOUT CONTRAST TECHNIQUE: Contiguous axial images were obtained from the base of the skull through the vertex without intravenous contrast. RADIATION DOSE REDUCTION: This exam was performed according to the  departmental dose-optimization program which includes automated exposure control, adjustment of the mA and/or kV according to patient size and/or use of iterative reconstruction technique. COMPARISON:  CT head 11/26/2017. FINDINGS: Brain: No evidence of acute infarction, hemorrhage, hydrocephalus, extra-axial collection or mass lesion/mass effect. 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 Vascular: No hyperdense vessel identified. Skull: No acute fracture. Sinuses/Orbits: Clear visualized sinuses.  No acute orbital findings Other: No mastoid effusions. IMPRESSION: No evidence of acute intracranial abnormality. Electronically Signed   By: Margaretha Sheffield M.D.   On: 06/05/2022 14:11   DG Chest Port 1 View  Result Date: 06/05/2022 CLINICAL DATA:  Sepsis. EXAM: PORTABLE CHEST 1 VIEW COMPARISON:  01/26/2014 FINDINGS: Stable surgical changes from valve replacement surgery. The heart is within normal limits in size given the AP projection, portable technique, low lung volumes and patient's age. Low lung volumes with vascular crowding and bibasilar atelectasis. No pleural effusions. No focal airspace consolidation. IMPRESSION: Low lung volumes with vascular crowding and bibasilar atelectasis. Electronically Signed   By: Marijo Sanes M.D.   On: 06/05/2022 11:08      Labs: BNP (last 3 results) No results for input(s): "BNP" in the last 8760 hours. Basic Metabolic Panel: Recent Labs  Lab 06/08/22 0732 06/12/22 0609  NA 138 138  K 3.5 3.6  CL 109 110  CO2 23 24  GLUCOSE 102* 102*  BUN 23 8  CREATININE 1.03  1.00 0.91  CALCIUM 8.0* 8.0*   Liver Function Tests: No results for input(s): "AST", "ALT", "ALKPHOS", "BILITOT", "PROT", "ALBUMIN" in the last 168 hours. No results for input(s): "LIPASE", "AMYLASE" in the last 168 hours. No results for input(s): "AMMONIA" in the last 168 hours. CBC: Recent Labs  Lab 06/08/22 0732 06/09/22 0456 06/10/22 0459 06/12/22 0609 06/13/22 0425  WBC 6.6 6.3  7.0 9.1 9.0  HGB 10.7* 10.3* 10.2* 9.6* 9.5*  HCT 32.6* 30.9* 30.7* 28.8* 29.5*  MCV 86.7 85.8 87.0 87.0 87.3  PLT 137* 144* 150 200 223   Cardiac Enzymes: No results for input(s): "CKTOTAL", "CKMB", "CKMBINDEX", "TROPONINI" in the last 168 hours. BNP: Invalid input(s): "POCBNP" CBG: No results for input(s): "GLUCAP" in the last 168 hours. D-Dimer No  results for input(s): "DDIMER" in the last 72 hours. Hgb A1c No results for input(s): "HGBA1C" in the last 72 hours. Lipid Profile No results for input(s): "CHOL", "HDL", "LDLCALC", "TRIG", "CHOLHDL", "LDLDIRECT" in the last 72 hours. Thyroid function studies No results for input(s): "TSH", "T4TOTAL", "T3FREE", "THYROIDAB" in the last 72 hours.  Invalid input(s): "FREET3" Anemia work up No results for input(s): "VITAMINB12", "FOLATE", "FERRITIN", "TIBC", "IRON", "RETICCTPCT" in the last 72 hours. Urinalysis    Component Value Date/Time   COLORURINE YELLOW (A) 06/05/2022 1051   APPEARANCEUR CLOUDY (A) 06/05/2022 1051   APPEARANCEUR Clear 01/26/2014 0600   LABSPEC 1.024 06/05/2022 1051   LABSPEC 1.014 01/26/2014 0600   PHURINE 5.0 06/05/2022 1051   GLUCOSEU >=500 (A) 06/05/2022 1051   GLUCOSEU Negative 01/26/2014 0600   HGBUR MODERATE (A) 06/05/2022 1051   BILIRUBINUR NEGATIVE 06/05/2022 1051   BILIRUBINUR Negative 01/26/2014 0600   KETONESUR NEGATIVE 06/05/2022 1051   PROTEINUR 30 (A) 06/05/2022 1051   NITRITE NEGATIVE 06/05/2022 1051   LEUKOCYTESUR MODERATE (A) 06/05/2022 1051   LEUKOCYTESUR Negative 01/26/2014 0600   Sepsis Labs Recent Labs  Lab 06/09/22 0456 06/10/22 0459 06/12/22 0609 06/13/22 0425  WBC 6.3 7.0 9.1 9.0   Microbiology Recent Results (from the past 240 hour(s))  Blood Culture (routine x 2)     Status: Abnormal   Collection Time: 06/05/22 10:51 AM   Specimen: BLOOD  Result Value Ref Range Status   Specimen Description   Final    BLOOD LEFT ANTECUBITAL Performed at [facility], [address] [place], [place] [address]    Special Requests   Final    BOTTLES DRAWN AEROBIC AND ANAEROBIC Blood Culture results may not be optimal due to an excessive volume of blood received in culture bottles Performed at [facility], [address] [place], [place] [address]    Culture  Setup Time   Final    Organism ID to follow IN BOTH AEROBIC AND ANAEROBIC BOTTLES CRITICAL RESULT CALLED TO, READ BACK BY AND VERIFIED WITH: GRAM NEGATIVE RODS Performed at [facility], [place] [address] [place], [place] [address]    Culture PASTEURELLA MULTOCIDA (A)  Final   Report Status 06/08/2022 FINAL  Final   Organism ID, Bacteria PASTEURELLA MULTOCIDA  Final      Susceptibility   Pasteurella multocida - MIC*    CEFAZOLIN <=4 SENSITIVE Sensitive     IMIPENEM 0.5 SENSITIVE Sensitive     * PASTEURELLA MULTOCIDA  Urine Culture     Status: Abnormal   Collection Time: 06/05/22 10:51 AM   Specimen: In/Out Cath Urine  Result Value Ref Range Status   Specimen Description   Final    IN/OUT CATH URINE Performed at [facility], [address], [place],  [address]    Special Requests   Final    NONE Performed at [facility], [address] [place], [place] [address]    Culture 3,000 COLONIES/mL STAPHYLOCOCCUS EPIDERMIDIS (A)  Final   Report Status 06/08/2022 FINAL  Final   Organism ID, Bacteria STAPHYLOCOCCUS EPIDERMIDIS (A)  Final      Susceptibility   Staphylococcus epidermidis - MIC*    CIPROFLOXACIN <=0.5 SENSITIVE Sensitive     GENTAMICIN <=0.5 SENSITIVE Sensitive     NITROFURANTOIN <=16 SENSITIVE Sensitive     OXACILLIN >=4 RESISTANT Resistant     TETRACYCLINE >=16 RESISTANT Resistant     VANCOMYCIN 2 SENSITIVE Sensitive     TRIMETH/SULFA <=10 SENSITIVE Sensitive  CLINDAMYCIN >=8 RESISTANT Resistant     RIFAMPIN <=0.5 SENSITIVE Sensitive     Inducible Clindamycin NEGATIVE Sensitive     * 3,000 COLONIES/mL STAPHYLOCOCCUS EPIDERMIDIS   Blood Culture ID Panel (Reflexed)     Status: None   Collection Time: 06/05/22 10:51 AM  Result Value Ref Range Status   Enterococcus faecalis NOT DETECTED NOT DETECTED Final   Enterococcus Faecium NOT DETECTED NOT DETECTED Final   Listeria monocytogenes NOT DETECTED NOT DETECTED Final   Staphylococcus species NOT DETECTED NOT DETECTED Final   Staphylococcus aureus (BCID) NOT DETECTED NOT DETECTED Final   Staphylococcus epidermidis NOT DETECTED NOT DETECTED Final   Staphylococcus lugdunensis NOT DETECTED NOT DETECTED Final   Streptococcus species NOT DETECTED NOT DETECTED Final   Streptococcus agalactiae NOT DETECTED NOT DETECTED Final   Streptococcus pneumoniae NOT DETECTED NOT DETECTED Final   Streptococcus pyogenes NOT DETECTED NOT DETECTED Final   A.calcoaceticus-baumannii NOT DETECTED NOT DETECTED Final   Bacteroides fragilis NOT DETECTED NOT DETECTED Final   Enterobacterales NOT DETECTED NOT DETECTED Final   Enterobacter cloacae complex NOT DETECTED NOT DETECTED Final   Escherichia coli NOT DETECTED NOT DETECTED Final   Klebsiella aerogenes NOT DETECTED NOT DETECTED Final   Klebsiella oxytoca NOT DETECTED NOT DETECTED Final   Klebsiella pneumoniae NOT DETECTED NOT DETECTED Final   Proteus species NOT DETECTED NOT DETECTED Final   Salmonella species NOT DETECTED NOT DETECTED Final   Serratia marcescens NOT DETECTED NOT DETECTED Final   Haemophilus influenzae NOT DETECTED NOT DETECTED Final   Neisseria meningitidis NOT DETECTED NOT DETECTED Final   Pseudomonas aeruginosa NOT DETECTED NOT DETECTED Final   Stenotrophomonas maltophilia NOT DETECTED NOT DETECTED Final   Candida albicans NOT DETECTED NOT DETECTED Final   Candida auris NOT DETECTED NOT DETECTED Final   Candida glabrata NOT DETECTED NOT DETECTED Final   Candida krusei NOT DETECTED NOT DETECTED Final   Candida parapsilosis NOT DETECTED NOT DETECTED Final   Candida tropicalis NOT DETECTED NOT DETECTED Final    Cryptococcus neoformans/gattii NOT DETECTED NOT DETECTED Final    Comment: Performed at [facility], [address], [place], [place] [address]  Blood Culture (routine x 2)     Status: Abnormal   Collection Time: 06/05/22 10:52 AM   Specimen: BLOOD  Result Value Ref Range Status   Specimen Description   Final    BLOOD RIGHT ANTECUBITAL Performed at [facility], [address] [place], [place] [address]    Special Requests   Final    BOTTLES DRAWN AEROBIC AND ANAEROBIC Blood Culture results may not be optimal due to an excessive volume of blood received in culture bottles Performed at [facility], [address] [place], [place] [address]    Culture  Setup Time   Final    IN BOTH AEROBIC AND ANAEROBIC BOTTLES CRITICAL VALUE NOTED.  VALUE IS CONSISTENT WITH PREVIOUSLY REPORTED AND CALLED VALUE. GRAM NEGATIVE RODS    Culture (A)  Final    PASTEURELLA MULTOCIDA SUSCEPTIBILITIES PERFORMED ON PREVIOUS CULTURE WITHIN THE LAST 5 DAYS. Performed at [facility], [place] [address], [place], [place] [address]    Report Status 06/08/2022 FINAL  Final  Resp Panel by RT-PCR (Flu A&B, Covid) Anterior Nasal Swab     Status: None   Collection Time: 06/05/22 10:52 AM   Specimen: Anterior Nasal Swab  Result Value Ref Range Status   SARS Coronavirus 2 by RT PCR NEGATIVE NEGATIVE Final    Comment: (  NOTE) SARS-CoV-2 target nucleic acids are NOT DETECTED.  The SARS-CoV-2 RNA is generally detectable in upper respiratory specimens during the acute phase of infection. The lowest concentration of SARS-CoV-2 viral copies this assay can detect is 138 copies/mL. A negative result does not preclude SARS-Cov-2 infection and should not be used as the sole basis for treatment or other patient management decisions. A negative result may occur with  improper specimen collection/handling, submission of specimen other than nasopharyngeal swab, presence of viral mutation(s) within  the areas targeted by this assay, and inadequate number of viral copies(<138 copies/mL). A negative result must be combined with clinical observations, patient history, and epidemiological information. The expected result is Negative.  Fact Sheet for Patients:  BloggerCourse.com  Fact Sheet for Healthcare Providers:  SeriousBroker.it  This test is no t yet approved or cleared by the [place] FDA and  has been authorized for detection and/or diagnosis of SARS-CoV-2 by FDA under an Emergency Use Authorization (EUA). This EUA will remain  in effect (meaning this test can be used) for the duration of the COVID-19 declaration under Section 564(b)(1) of the Act, 21 U.S.C.section 360bbb-3(b)(1), unless the authorization is terminated  or revoked sooner.       Influenza A by PCR NEGATIVE NEGATIVE Final   Influenza B by PCR NEGATIVE NEGATIVE Final    Comment: (NOTE) The Xpert Xpress SARS-CoV-2/FLU/RSV plus assay is intended as an aid in the diagnosis of influenza from Nasopharyngeal swab specimens and should not be used as a sole basis for treatment. Nasal washings and aspirates are unacceptable for Xpert Xpress SARS-CoV-2/FLU/RSV testing.  Fact Sheet for Patients: BloggerCourse.com  Fact Sheet for Healthcare Providers: SeriousBroker.it  This test is not yet approved or cleared by the [place] FDA and has been authorized for detection and/or diagnosis of SARS-CoV-2 by FDA under an Emergency Use Authorization (EUA). This EUA will remain in effect (meaning this test can be used) for the duration of the COVID-19 declaration under Section 564(b)(1) of the Act, 21 U.S.C. section 360bbb-3(b)(1), unless the authorization is terminated or revoked.  Performed at [facility], [address] [place], [place] [address]   MRSA Next Gen by PCR, Nasal     Status: None    Collection Time: 06/06/22  3:31 PM   Specimen: Nasal Mucosa; Nasal Swab  Result Value Ref Range Status   MRSA by PCR Next Gen NOT DETECTED NOT DETECTED Final    Comment: (NOTE) The GeneXpert MRSA Assay (FDA approved for NASAL specimens only), is one component of a comprehensive MRSA colonization surveillance program. It is not intended to diagnose MRSA infection nor to guide or monitor treatment for MRSA infections. Test performance is not FDA approved in patients less than 73 years old. Performed at [facility], [address] [place], [place] [address]   Culture, blood (Routine X 2) w Reflex to ID Panel     Status: None   Collection Time: 06/08/22 11:01 AM   Specimen: BLOOD  Result Value Ref Range Status   Specimen Description BLOOD BLOOD RIGHT HAND  Final   Special Requests   Final    BOTTLES DRAWN AEROBIC AND ANAEROBIC Blood Culture adequate volume   Culture   Final    NO GROWTH 5 DAYS Performed at [facility], [address], [place], [place] [address]    Report Status 06/13/2022 FINAL  Final  Culture, blood (Routine X 2) w Reflex to ID Panel     Status: None  Collection Time: 06/08/22 11:01 AM   Specimen: BLOOD  Result Value Ref Range Status   Specimen Description BLOOD BLOOD LEFT HAND  Final   Special Requests   Final    BOTTLES DRAWN AEROBIC AND ANAEROBIC Blood Culture results may not be optimal due to an inadequate volume of blood received in culture bottles   Culture   Final    NO GROWTH 5 DAYS Performed at [facility], [address], [place], [place] [address]    Report Status 06/13/2022 FINAL  Final     Total time spend on discharging this patient, including the last patient exam, discussing the hospital stay, instructions for ongoing care as it relates to all pertinent caregivers, as well as preparing the medical discharge records, prescriptions, and/or referrals as applicable, is 60 minutes.    Darlin Priestly, MD  [facility] 06/13/2022, 1:30 PM

## 2022-06-13 NOTE — Progress Notes (Signed)
Spoke to his son on the phone this morning He did not want his father to go home with PICC and IV antibiotic , as he will not be able to administer Iv antibiotic  decision was made with his daughter yesterday to send him on IV unasyn/or IV ceftriaxone.  So PICC will be removed Pt will be switched to PO augmentin until 8/823

## 2022-06-13 NOTE — TOC Progression Note (Addendum)
Transition of Care [facility]) - Progression Note    Patient Details  Name: Dean Cruz MRN: [id number] Date of Birth: 1927-05-11  Transition of Care [facility]) CM/SW Contact  Truddie Hidden, RN Phone Number: 06/13/2022, 9:55 AM  Clinical Narrative:    Sherron Monday with Jeri Modena of [employer] regarding VA authorization. Authorization still pending. Pam plans to do teaching with family late this afternoon.   11:42am  Spoke with patient's son regarding discharge plan. Patient son stated he and family did not feel comfortable with current plan for patient to discharge home with IV antibiotic infusions. Patient's son stated he had spoken with the infectious disase provider and the medication for IV infusion was being changed from IV to PO. Patient son declined HH as well. Feliberto Gottron from [place], and [employer] from [place] notified.     Expected Discharge Plan: Home w Home Health Services Barriers to Discharge: Continued Medical Work up  Expected Discharge Plan and Services Expected Discharge Plan: Home w Home Health Services   Discharge Planning Services: CM Consult Post Acute Care Choice: Home Health Living arrangements for the past 2 months: Single Family Home                 DME Arranged: N/A         HH Arranged: RN, IV Antibiotics HH Agency: [facility] Date HH Agency Contacted: 06/12/22 Time HH Agency Contacted: 1444 Representative spoke with at [facility] Agency: Feliberto Gottron   Social Determinants of Health (SDOH) Interventions    Readmission Risk Interventions     No data to display

## 2022-06-13 NOTE — Consult Note (Signed)
ANTICOAGULATION CONSULT NOTE  Pharmacy Consult for Lovenox and Warfarin Management Indication:  Bridging for history of mechanical mitral valve on warfarin  Patient Measurements: Height: 5\' 6"  (167.6 cm) Weight: 78.5 kg (173 lb 1 oz) IBW/kg (Calculated) : 63.8  Labs: Recent Labs    06/11/22 0433 06/12/22 0609 06/13/22 0425 06/13/22 0505  HGB  --  9.6* 9.5*  --   HCT  --  28.8* 29.5*  --   PLT  --  200 223  --   LABPROT 16.9* 16.6*  --  17.4*  INR 1.4* 1.4*  --  1.4*  HEPARINUNFRC 0.37 0.40  --   --   CREATININE  --  0.91  --   --     Estimated Creatinine Clearance: 50 mL/min (by C-G formula based on SCr of 0.91 mg/dL).  Medications:  Warfarin 1.25 mg on Tu and 2.5 mg every other day (TWD 16.25 mg)   Assessment: [name] with medical history including diastolic CHF, HTN, BPH, urinary retention / self catheterization, mechanical mitral valve replacement who is admitted with sepsis secondary to presumed UTI complicated by bacteremia and complete heart block s/p temporary pacemaker insertion on 7/26. Warfarin was reversed prior to procedure and is now subtherapeutic. Further instructions to hold warfarin pending anticipated permanent pacemaker placement on 7/31. Patient will also likely need to undergo TEE to rule out endocarditis. Pharmacy consulted manage anticoagulation for history of mechanical mitral valve.  TEE negative 7/28. PPM procedure completed 7/31  Date INR Plan Comments  7/25 3.8 1 mg   7/26 3.8 >> 1.9 Hold IV vitamin K 10 mg  7/27 1.3 Hold IV heparin started   7/28 1 Hold   7/29 1.3 Hold   7/30 1.3 Hold   7/31 1.4 2.5 mg   8/1 1.4 1.25 mg IV heparin stopped Therapeutic LMWH started  8/2 1.4 3mg  Continue lovenox bridge        Goal of Therapy:  INR 2.5 - 3.5   Plan:  Continue Lovenox 77.5 mg (1 mg/kg) q 12h until INR >/= 2.5 INR remains subtherapeutic , will give booster dose today due to unchanged INR in 3 days. Expected need 15 -17 mg of  warfarin (cumulative) to reach goal INR.  Daily INR / CBC  Dean Cruz PharmD, BCPS 06/13/2022 7:38 AM

## 2022-06-20 NOTE — Discharge Summary (Incomplete)
Physician Discharge Summary   Dean Cruz  male DOB: November 11, 1927  [id number]  PCP: [facility]  Admit date: 06/05/2022 Discharge date: 06/13/2022  Admitted From: *** Disposition:  {disposition:18248} Home Health: {yes/no:20286} CODE STATUS: {Palliative Code status:23503}  Discharge Instructions     Discharge instructions   Complete by: As directed    Please take antibiotic Augmentin every 12 hours, last day 06/19/22, for your blood infection.  Your INR is 1.4 today.  Please take 1.5 tablets of your warfarin this evening 06/13/22, and then resume your regular warfarin dosing starting tomorrow.  Please get your INR checked on Friday 06/15/22.  You need to take Lovenox injection every 12 hours until your INR is therapeutic (your coumadin clinic should let you know when to stop taking Lovenox).  Please hold Lisinopril until followup with your PCP because your blood pressure has been normal without it.   Dr. Enzo Bi - -   No wound care   Complete by: As directed       Hospital Course:  For full details, please see H&P, progress notes, consult notes and ancillary notes.  Briefly,  Dean Cruz is a 88 y.o. male with medical history significant for chronic diastolic dysfunction CHF, hypertension, BPH with history of urinary retention and self-catheterization who was brought into the ER by EMS for evaluation of generalized weakness.  He had a recent cat bite involving his left leg and had some redness over the left anterior leg.     7/26: Patient developed symptomatic bradycardia requiring one-time dose of atropine.  EKG found to have complete heart block.  Cardiology was consulted and a temporary percutaneous pacemaker was placed.      * Sepsis  As evidenced by fever with a Tmax of 101.6, relative hypotension with systolic blood pressure in the 90s, lactic acidosis and pyuria. Patient has a history of BPH with urinary retention and does self-catheterization  twice a day.  No urinary symptoms. Blood cultures positive for Pasteurella.  History of cat bite on the left anterior shin few days ago. Patient also has an history of mechanical heart valve. ID consulted for concern of Pasteurella bacteremia.  Antibiotics were switched to Zosyn followed by another switch to Unasyn Repeat blood cultures negative. TEE was negative for endocarditis -Continue Unasyn-Will need 1 more week of antibiotics, stop date of 06/19/2022 -PICC line in place     Complete heart block [facility]) S/p permanent pacemaker placement today -Appreciate cardiology recommendations. -Continue to monitor -Lovenox bridge with Coumadin starting from tomorrow   Hypertension Blood pressure within goal after restarting home amlodipine -Continue home amlodipine Continue holding  lisinopril and metoprolol    urinary retention Patient has a known history of BPH and was noted to have urinary retention upon arrival to the ER.  History of self-catheterization. Imaging showed markedly distended urinary bladder and a Foley catheter was placed in the ER -Continue Flomax and finasteride   Hypothyroidism Stable Continue Synthroid   H/O heart valve replacement with mechanical valve Patient has a history of mechanical heart valve (Aortic) and is on Coumadin.Marland Kitchen Received 1 dose of IV vitamin K to reverse for cardiac procedure INR at 1.3 today -Heparin infusion until permanent pacemaker placed on Monday   AKI Cr 1.50 on presentation, improved to 0.91 prior to discharge.  Unless noted above, medications under "STOP" list are ones pt was not taking PTA.  Discharge Diagnoses:  Principal Problem:   Sepsis  Active Problems:   Complete heart block ([place])  Hypertension    urinary retention   Hypothyroidism   H/O heart valve replacement with mechanical valve   30 Day Unplanned Readmission Risk Score    Flowsheet Row ED to Hosp-Admission (Current) from 06/05/2022 in [facility]  30 Day Unplanned Readmission Risk Score (%) 17.6 Filed at 06/13/2022 1200       This score is the patient's risk of an unplanned readmission within 30 days of being discharged (0 -100%). The score is based on dignosis, age, lab data, medications, orders, and past utilization.   Low:  0-14.9   Medium: 15-21.9   High: 22-29.9   Extreme: 30 and above         Discharge Instructions:  Allergies as of 06/13/2022   No Known Allergies      Medication List     STOP taking these medications    metoprolol succinate 25 MG 24 hr tablet Commonly known as: TOPROL-XL       TAKE these medications    acetaminophen 500 MG tablet Commonly known as: TYLENOL Take 2 tablets by mouth every 6 (six) hours as needed.   amLODipine 10 MG tablet Commonly known as: NORVASC Take 5 mg by mouth daily.   amoxicillin-clavulanate 875-125 MG tablet Commonly known as: AUGMENTIN Take 1 tablet by mouth every 12 (twelve) hours for 7 days.   ascorbic acid 500 MG tablet Commonly known as: VITAMIN C Take 500 mg by mouth 2 (two) times daily. Noon and bedtime   Aspirin 81 MG Caps Take 1 tablet by mouth daily.   enoxaparin 80 MG/0.8ML injection Commonly known as: LOVENOX Inject 0.775 mLs (77.5 mg total) into the skin every 12 (twelve) hours for 7 days.   ferrous sulfate 325 (65 FE) MG EC tablet Take 324 mg by mouth daily with breakfast.   finasteride 5 MG tablet Commonly known as: PROSCAR Take 5 mg by mouth daily.   levothyroxine 100 MCG tablet Commonly known as: SYNTHROID Take 100 mcg by mouth daily before breakfast.   lisinopril 40 MG tablet Commonly known as: ZESTRIL Hold until followup with PCP since your blood pressure has been normal without it in the hospital. What changed:  how much to take how to take this when to take this additional instructions   polyethylene glycol 17 g packet Commonly known as: MIRALAX / GLYCOLAX Take 17 g by mouth daily.   rosuvastatin 40 MG  tablet Commonly known as: CRESTOR Take 40 mg by mouth at bedtime.   tamsulosin 0.4 MG Caps capsule Commonly known as: FLOMAX Take 1 capsule by mouth daily.   warfarin 2.5 MG tablet Commonly known as: COUMADIN Take 2.5 mg by mouth daily. Take one tablet by mouth every day, except take 1.25 Tuesday.         Follow-up Information     Laurier NancyKhan, Shaukat A, MD. Go in 1 week(s).   Specialty: Cardiology Contact information: [address] [place] [address] [phone]         [facility] Follow up in 1 week(s).   Specialty: General Practice Contact information: [address] [place] [address] [phone]         Coumadin clinic Follow up on 06/15/2022.   Why: INR check.                No Known Allergies   The results of significant diagnostics from this hospitalization (including imaging, microbiology, ancillary and laboratory) are listed below for reference.   Consultations:  Procedures/Studies: [place] EKG SITE RITE  Result Date: 06/11/2022 If Site Rite image not attached, placement could not be confirmed due to current cardiac rhythm.  EP PPM/ICD IMPLANT  Result Date: 06/11/2022 Successful Micra AV leadless pacemaker implantation   ECHO TEE  Result Date: 06/08/2022    TRANSESOPHOGEAL ECHO REPORT   Patient Name:   RMANI KELLOGG Date of Exam: 06/08/2022 Medical Rec #:  [id number]        Height:       66.0 in Accession #:    [id number]       Weight:       173.1 lb Date of Birth:  [birth year]        BSA:          1.881 m Patient Age:    88 years         BP:           171/72 mmHg Patient Gender: M                HR:           69 bpm. Exam Location:  [facility] Procedure: Transesophageal Echo, Cardiac Doppler and Color Doppler Indications:     Not listed on TEE check-in sheet  History:         Patient has prior history of Echocardiogram examinations, most                  recent 06/06/2022. CHF; Risk Factors:Hypertension.  Sonographer:     Cristela Blue  Referring Phys:  [id number] A KHAN Diagnosing Phys: Adrian Blackwater PROCEDURE: The transesophogeal probe was passed without difficulty through the esophogus of the patient. Local oropharyngeal anesthetic was provided with Benzocaine spray. Sedation performed by performing physician. The patient's vital signs; including heart rate, blood pressure, and oxygen saturation; remained stable throughout the procedure. The patient developed no complications during the procedure. IMPRESSIONS  1. Left ventricular ejection fraction, by estimation, is 60 to 65%. The left ventricle has normal function. The left ventricle has no regional wall motion abnormalities.  2. Right ventricular systolic function is normal. The right ventricular size is normal.  3. Left atrial size was moderately dilated. No left atrial/left atrial appendage thrombus was detected.  4. The mitral valve has been repaired/replaced. Mild mitral valve regurgitation. No evidence of mitral stenosis.  5. The aortic valve is calcified. Aortic valve regurgitation is not visualized. Aortic valve sclerosis/calcification is present, without any evidence of aortic stenosis.  6. The inferior vena cava is normal in size with greater than 50% respiratory variability, suggesting right atrial pressure of 3 mmHg. Conclusion(s)/Recommendation(s): Normal biventricular function without evidence of hemodynamically significant valvular heart disease. No evidence of vegetation/infective endocarditis on this transesophageael echocardiogram. FINDINGS  Left Ventricle: Left ventricular ejection fraction, by estimation, is 60 to 65%. The left ventricle has normal function. The left ventricle has no regional wall motion abnormalities. The left ventricular internal cavity size was normal in size. There is  no left ventricular hypertrophy. Right Ventricle: The right ventricular size is normal. No increase in right ventricular wall thickness. Right ventricular systolic function is normal.  Left Atrium: Left atrial size was moderately dilated. No left atrial/left atrial appendage thrombus was detected. Right Atrium: Right atrial size was normal in size. Pericardium: There is no evidence of pericardial effusion. Mitral Valve: The mitral valve has been repaired/replaced. Mild mitral valve regurgitation. No evidence of mitral valve stenosis. There is no evidence of mitral valve vegetation. Tricuspid Valve: The tricuspid valve  is normal in structure. Tricuspid valve regurgitation is mild . No evidence of tricuspid stenosis. Aortic Valve: The aortic valve is calcified. Aortic valve regurgitation is not visualized. Aortic valve sclerosis/calcification is present, without any evidence of aortic stenosis. Pulmonic Valve: The pulmonic valve was normal in structure. Pulmonic valve regurgitation is not visualized. No evidence of pulmonic stenosis. Aorta: The aortic root is normal in size and structure. Venous: The inferior vena cava is normal in size with greater than 50% respiratory variability, suggesting right atrial pressure of 3 mmHg. IAS/Shunts: No atrial level shunt detected by color flow Doppler. Neoma Laming Electronically signed by Neoma Laming Signature Date/Time: 06/08/2022/10:41:08 AM    Final    DG Chest Port 1 View  Result Date: 06/07/2022 CLINICAL DATA:  Chest pain. EXAM: PORTABLE CHEST 1 VIEW COMPARISON:  June 05, 2022 FINDINGS: Multiple sternal wires are noted. A right internal jugular venous catheter is seen with its distal tip seen within the right atrium. This is approximately 6.3 cm distal to the junction of the superior vena cava and right atrium and represents a new finding when compared to the prior study. The heart size and mediastinal contours are within normal limits. An artificial cardiac valve is noted. There is moderate severity prominence of the bilateral perihilar pulmonary vasculature. Mild atelectasis is seen within the bilateral lung bases. There is no evidence of a pleural  effusion or pneumothorax. No acute osseous abnormalities are identified. IMPRESSION: 1. Interval right internal jugular venous catheter placement positioning, as described above. 2. Moderate severity pulmonary vascular congestion. 3. Mild bibasilar atelectasis. Electronically Signed   By: Virgina Norfolk M.D.   On: 06/07/2022 00:21   CARDIAC CATHETERIZATION  Result Date: 06/06/2022 Successful placement of transvenous temporary pacemaker via right internal jugular vein   ECHOCARDIOGRAM COMPLETE  Result Date: 06/06/2022    ECHOCARDIOGRAM REPORT   Patient Name:   GERRED ARNTZEN Date of Exam: 06/06/2022 Medical Rec #:  [id number]        Height:       68.0 in Accession #:    [id number]       Weight:       183.0 lb Date of Birth:  01-18-1927        BSA:          1.968 m Patient Age:    25 years         BP:           103/50 mmHg Patient Gender: M                HR:           41 bpm. Exam Location:  [facility] Procedure: 2D Echo, Color Doppler and Cardiac Doppler Indications:     Endocarditis I38  History:         Patient has prior history of Echocardiogram examinations, most                  recent 03/28/2018. CHF; Risk Factors:Hypertension. History of                  mechanical Mitral valve replacement.  Sonographer:     Sherrie Sport Referring Phys:  [place] Diagnosing Phys: Neoma Laming  Sonographer Comments: Technically challenging study due to limited acoustic windows and suboptimal apical window. IMPRESSIONS  1. Left ventricular ejection fraction, by estimation, is 60 to 65%. The left ventricle has normal function. The left ventricle has no regional wall motion abnormalities. There is moderate  left ventricular hypertrophy. Left ventricular diastolic parameters are consistent with Grade I diastolic dysfunction (impaired relaxation).  2. Right ventricular systolic function is mildly reduced. The right ventricular size is moderately enlarged.  3. Left atrial size was mild to moderately dilated.  4. Right  atrial size was mild to moderately dilated.  5. The mitral valve has been repaired/replaced. Mild mitral valve regurgitation. No evidence of mitral stenosis. Severe mitral annular calcification.  6. The aortic valve is calcified. Aortic valve regurgitation is trivial. Mild aortic valve stenosis.  7. The inferior vena cava is normal in size with greater than 50% respiratory variability, suggesting right atrial pressure of 3 mmHg. Comparison(s): Mechanical MVR. Conclusion(s)/Recommendation(s): Valvular findings as outlined below. FINDINGS  Left Ventricle: Left ventricular ejection fraction, by estimation, is 60 to 65%. The left ventricle has normal function. The left ventricle has no regional wall motion abnormalities. The left ventricular internal cavity size was normal in size. There is  moderate left ventricular hypertrophy. Left ventricular diastolic parameters are consistent with Grade I diastolic dysfunction (impaired relaxation). Right Ventricle: The right ventricular size is moderately enlarged. No increase in right ventricular wall thickness. Right ventricular systolic function is mildly reduced. Left Atrium: Left atrial size was mild to moderately dilated. Right Atrium: Right atrial size was mild to moderately dilated. Pericardium: There is no evidence of pericardial effusion. Mitral Valve: The mitral valve has been repaired/replaced. There is severe thickening of the mitral valve leaflet(s). There is severe calcification of the mitral valve leaflet(s). Moderately decreased mobility of the mitral valve leaflets. Severe mitral annular calcification. Mild mitral valve regurgitation. No evidence of mitral valve stenosis. MV peak gradient, 7.7 mmHg. The mean mitral valve gradient is 3.0 mmHg. Mechanical mitral valve replacement, will need TEE to r/o Endocarditis Tricuspid Valve: The tricuspid valve is normal in structure. Tricuspid valve regurgitation is mild . No evidence of tricuspid stenosis. Aortic Valve:  The aortic valve is calcified. Aortic valve regurgitation is trivial. Mild aortic stenosis is present. Aortic valve mean gradient measures 15.0 mmHg. Aortic valve peak gradient measures 30.0 mmHg. Aortic valve area, by VTI measures 1.12 cm. Pulmonic Valve: The pulmonic valve was normal in structure. Pulmonic valve regurgitation is not visualized. No evidence of pulmonic stenosis. Aorta: The aortic root is normal in size and structure. Venous: The inferior vena cava is normal in size with greater than 50% respiratory variability, suggesting right atrial pressure of 3 mmHg. IAS/Shunts: No atrial level shunt detected by color flow Doppler.  LEFT VENTRICLE PLAX 2D LVIDd:         4.90 cm LVIDs:         3.00 cm LV PW:         1.50 cm LV IVS:        1.20 cm LVOT diam:     2.20 cm LV SV:         61 LV SV Index:   31 LVOT Area:     3.80 cm  RIGHT VENTRICLE RV S prime:     9.79 cm/s TAPSE (M-mode): 2.6 cm LEFT ATRIUM            Index        RIGHT ATRIUM           Index LA diam:      4.20 cm  2.13 cm/m   RA Area:     19.70 cm LA Vol (A2C): 149.0 ml 75.71 ml/m  RA Volume:   58.10 ml  29.52 ml/m LA Vol (A4C):  97.8 ml  49.70 ml/m  AORTIC VALVE AV Area (Vmax):    0.91 cm AV Area (Vmean):   0.98 cm AV Area (VTI):     1.12 cm AV Vmax:           273.67 cm/s AV Vmean:          172.667 cm/s AV VTI:            0.547 m AV Peak Grad:      30.0 mmHg AV Mean Grad:      15.0 mmHg LVOT Vmax:         65.30 cm/s LVOT Vmean:        44.500 cm/s LVOT VTI:          0.161 m LVOT/AV VTI ratio: 0.29  AORTA Ao Root diam: 3.20 cm MITRAL VALVE               TRICUSPID VALVE MV Area (PHT): 1.89 cm    TR Peak grad:   21.5 mmHg MV Area VTI:   1.59 cm    TR Vmax:        232.00 cm/s MV Peak grad:  7.7 mmHg MV Mean grad:  3.0 mmHg    SHUNTS MV Vmax:       1.38 m/s    Systemic VTI:  0.16 m MV Vmean:      78.5 cm/s   Systemic Diam: 2.20 cm MV Decel Time: 402 msec MV E velocity: 97.70 cm/s MV A velocity: 74.10 cm/s MV E/A ratio:  1.32 Shaukat Khan  Electronically signed by Adrian Blackwater Signature Date/Time: 06/06/2022/2:13:03 PM    Final    CT ABDOMEN PELVIS W CONTRAST  Result Date: 06/05/2022 CLINICAL DATA:  Sepsis generalized weakness and fever. EXAM: CT ABDOMEN AND PELVIS WITH CONTRAST TECHNIQUE: Multidetector CT imaging of the abdomen and pelvis was performed using the standard protocol following bolus administration of intravenous contrast. RADIATION DOSE REDUCTION: This exam was performed according to the departmental dose-optimization program which includes automated exposure control, adjustment of the mA and/or kV according to patient size and/or use of iterative reconstruction technique. CONTRAST:  69mL OMNIPAQUE IOHEXOL 300 MG/ML  SOLN COMPARISON:  None available FINDINGS: Lower chest: Mitral valve replacement. Cardiomegaly. No pericardial effusion. Epicardial pacer wires are in place. Heart is incompletely imaged. Basilar atelectasis. No dense consolidation. No sign of pleural effusion. Post sternotomy. Hepatobiliary: Hepatic cysts. Smooth hepatic contours. Cyst in the LEFT liver and in the RIGHT hemiliver. Portal vein is patent. No pericholecystic stranding. Small gallstone in the dependent gallbladder. No biliary duct dilation. Pancreas: Mild pancreatic atrophy. 2.0 x 1.4 cm area of low attenuation measuring 18 Hounsfield units, cystic appearance in the tail of the pancreas. No peripancreatic stranding. No ductal dilation. Spleen: Normal. Adrenals/Urinary Tract: Adrenal glands are normal. Symmetric renal enhancement without hydronephrosis. No perinephric stranding. High density lesion (but less than adjacent renal cortex in terms of density) arises from the posterior cortex of the LEFT kidney, 84 Hounsfield units on venous phase and 66 Hounsfield units on delayed phase (image 38/2). Otherwise without suspicious renal lesion. No no substantial perinephric stranding. Urinary bladder is markedly distended extending up into the abdomen. No  perivesical stranding. No gross bladder wall thickening. Distal ureters with limited assessment along with bladder base due to the RIGHT hip arthroplasty the results in considerable streak artifact about the pelvis. Stomach/Bowel: No acute bowel process. Colonic diverticulosis. Appendix is normal. Stomach under distended. Vascular/Lymphatic: Aortic atherosclerosis both calcified and noncalcified. Mild infrarenal abdominal aortic  aneurysm at 3.4 x 3.2 cm. There is no gastrohepatic or hepatoduodenal ligament lymphadenopathy. No retroperitoneal or mesenteric lymphadenopathy. No pelvic sidewall lymphadenopathy. Reproductive: Grossly unremarkable by CT but area with considerably limited assessment due to this streak artifact from RIGHT hip arthroplasty. Other: No ascites.  No pneumoperitoneum. Musculoskeletal: Degenerative changes in the spine moderate to marked with RIGHT hip arthroplasty. Signs of median sternotomy. IMPRESSION: 1. Markedly distended urinary bladder extending up into the abdomen. Correlate with signs of bladder outlet obstruction or urinary retention. Urinary bladder extends well into the low abdomen from the pelvis approximately 16 cm greatest axial dimension. Would also correlate with urinalysis in the context of potential bladder outlet obstruction. 2. Posterior cortical lesion on the LEFT suspicious for small solid renal neoplasm in the lower pole of the LEFT kidney. Could consider comparison with prior imaging, if no prior imaging is available would suggest follow-up CT or MRI at 3-6 months with without contrast for further assessment. 3. Cystic pancreatic lesion without associated high-risk features at this time. Consider comparison with prior imaging or follow-up with MRI in 2 years as warranted. 4. Cholelithiasis without evidence of acute cholecystitis. 5. Mild infrarenal abdominal aortic aneurysm at 3.4 x 3.2 cm. 3.4 cm infrarenal abdominal aortic aneurysm. Recommend follow-up every 3 years.  Reference: J Am Coll Radiol [id number]. 6. Colonic diverticulosis without evidence of acute diverticulitis. 7. Aortic atherosclerosis. Aortic Atherosclerosis (ICD10-I70.0). Electronically Signed   By: Zetta Bills M.D.   On: 06/05/2022 14:19   CT HEAD WO CONTRAST (5MM)  Result Date: 06/05/2022 CLINICAL DATA:  Mental status change, unknown cause EXAM: CT HEAD WITHOUT CONTRAST TECHNIQUE: Contiguous axial images were obtained from the base of the skull through the vertex without intravenous contrast. RADIATION DOSE REDUCTION: This exam was performed according to the departmental dose-optimization program which includes automated exposure control, adjustment of the mA and/or kV according to patient size and/or use of iterative reconstruction technique. COMPARISON:  CT head 11/26/2017. FINDINGS: Brain: No evidence of acute infarction, hemorrhage, hydrocephalus, extra-axial collection or mass lesion/mass effect. 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 Vascular: No hyperdense vessel identified. Skull: No acute fracture. Sinuses/Orbits: Clear visualized sinuses.  No acute orbital findings Other: No mastoid effusions. IMPRESSION: No evidence of acute intracranial abnormality. Electronically Signed   By: Margaretha Sheffield M.D.   On: 06/05/2022 14:11   DG Chest Port 1 View  Result Date: 06/05/2022 CLINICAL DATA:  Sepsis. EXAM: PORTABLE CHEST 1 VIEW COMPARISON:  01/26/2014 FINDINGS: Stable surgical changes from valve replacement surgery. The heart is within normal limits in size given the AP projection, portable technique, low lung volumes and patient's age. Low lung volumes with vascular crowding and bibasilar atelectasis. No pleural effusions. No focal airspace consolidation. IMPRESSION: Low lung volumes with vascular crowding and bibasilar atelectasis. Electronically Signed   By: Marijo Sanes M.D.   On: 06/05/2022 11:08      Labs: BNP (last 3 results) No results for input(s): "BNP" in the last 8760  hours. Basic Metabolic Panel: Recent Labs  Lab 06/08/22 0732 06/12/22 0609  NA 138 138  K 3.5 3.6  CL 109 110  CO2 23 24  GLUCOSE 102* 102*  BUN 23 8  CREATININE 1.03  1.00 0.91  CALCIUM 8.0* 8.0*   Liver Function Tests: No results for input(s): "AST", "ALT", "ALKPHOS", "BILITOT", "PROT", "ALBUMIN" in the last 168 hours. No results for input(s): "LIPASE", "AMYLASE" in the last 168 hours. No results for input(s): "AMMONIA" in the last 168 hours. CBC: Recent  Labs  Lab 06/08/22 0732 06/09/22 0456 06/10/22 0459 06/12/22 0609 06/13/22 0425  WBC 6.6 6.3 7.0 9.1 9.0  HGB 10.7* 10.3* 10.2* 9.6* 9.5*  HCT 32.6* 30.9* 30.7* 28.8* 29.5*  MCV 86.7 85.8 87.0 87.0 87.3  PLT 137* 144* 150 200 223   Cardiac Enzymes: No results for input(s): "CKTOTAL", "CKMB", "CKMBINDEX", "TROPONINI" in the last 168 hours. BNP: Invalid input(s): "POCBNP" CBG: No results for input(s): "GLUCAP" in the last 168 hours. D-Dimer No results for input(s): "DDIMER" in the last 72 hours. Hgb A1c No results for input(s): "HGBA1C" in the last 72 hours. Lipid Profile No results for input(s): "CHOL", "HDL", "LDLCALC", "TRIG", "CHOLHDL", "LDLDIRECT" in the last 72 hours. Thyroid function studies No results for input(s): "TSH", "T4TOTAL", "T3FREE", "THYROIDAB" in the last 72 hours.  Invalid input(s): "FREET3" Anemia work up No results for input(s): "VITAMINB12", "FOLATE", "FERRITIN", "TIBC", "IRON", "RETICCTPCT" in the last 72 hours. Urinalysis    Component Value Date/Time   COLORURINE YELLOW (A) 06/05/2022 1051   APPEARANCEUR CLOUDY (A) 06/05/2022 1051   APPEARANCEUR Clear 01/26/2014 0600   LABSPEC 1.024 06/05/2022 1051   LABSPEC 1.014 01/26/2014 0600   PHURINE 5.0 06/05/2022 1051   GLUCOSEU >=500 (A) 06/05/2022 1051   GLUCOSEU Negative 01/26/2014 0600   HGBUR MODERATE (A) 06/05/2022 1051   BILIRUBINUR NEGATIVE 06/05/2022 1051   BILIRUBINUR Negative 01/26/2014 0600   KETONESUR NEGATIVE 06/05/2022  1051   PROTEINUR 30 (A) 06/05/2022 1051   NITRITE NEGATIVE 06/05/2022 1051   LEUKOCYTESUR MODERATE (A) 06/05/2022 1051   LEUKOCYTESUR Negative 01/26/2014 0600   Sepsis Labs Recent Labs  Lab 06/09/22 0456 06/10/22 0459 06/12/22 0609 06/13/22 0425  WBC 6.3 7.0 9.1 9.0   Microbiology Recent Results (from the past 240 hour(s))  Blood Culture (routine x 2)     Status: Abnormal   Collection Time: 06/05/22 10:51 AM   Specimen: BLOOD  Result Value Ref Range Status   Specimen Description   Final    BLOOD LEFT ANTECUBITAL Performed at [facility], [address] [place], [place] [address]    Special Requests   Final    BOTTLES DRAWN AEROBIC AND ANAEROBIC Blood Culture results may not be optimal due to an excessive volume of blood received in culture bottles Performed at [facility], [address] [place], [place] [address]    Culture  Setup Time   Final    Organism ID to follow IN BOTH AEROBIC AND ANAEROBIC BOTTLES CRITICAL RESULT CALLED TO, READ BACK BY AND VERIFIED WITH: GRAM NEGATIVE RODS Performed at [facility], [place] [address], [place], [place] [address]    Culture PASTEURELLA MULTOCIDA (A)  Final   Report Status 06/08/2022 FINAL  Final   Organism ID, Bacteria PASTEURELLA MULTOCIDA  Final      Susceptibility   Pasteurella multocida - MIC*    CEFAZOLIN <=4 SENSITIVE Sensitive     IMIPENEM 0.5 SENSITIVE Sensitive     * PASTEURELLA MULTOCIDA  Urine Culture     Status: Abnormal   Collection Time: 06/05/22 10:51 AM   Specimen: In/Out Cath Urine  Result Value Ref Range Status   Specimen Description   Final    IN/OUT CATH URINE Performed at [facility], [address] [place], [place] [address]    Special Requests   Final    NONE Performed at [facility], [address], [place], [place] [address]    Culture 3,000 COLONIES/mL STAPHYLOCOCCUS EPIDERMIDIS (A)  Final   Report  Status 06/08/2022 FINAL  Final   Organism ID,  Bacteria STAPHYLOCOCCUS EPIDERMIDIS (A)  Final      Susceptibility   Staphylococcus epidermidis - MIC*    CIPROFLOXACIN <=0.5 SENSITIVE Sensitive     GENTAMICIN <=0.5 SENSITIVE Sensitive     NITROFURANTOIN <=16 SENSITIVE Sensitive     OXACILLIN >=4 RESISTANT Resistant     TETRACYCLINE >=16 RESISTANT Resistant     VANCOMYCIN 2 SENSITIVE Sensitive     TRIMETH/SULFA <=10 SENSITIVE Sensitive     CLINDAMYCIN >=8 RESISTANT Resistant     RIFAMPIN <=0.5 SENSITIVE Sensitive     Inducible Clindamycin NEGATIVE Sensitive     * 3,000 COLONIES/mL STAPHYLOCOCCUS EPIDERMIDIS  Blood Culture ID Panel (Reflexed)     Status: None   Collection Time: 06/05/22 10:51 AM  Result Value Ref Range Status   Enterococcus faecalis NOT DETECTED NOT DETECTED Final   Enterococcus Faecium NOT DETECTED NOT DETECTED Final   Listeria monocytogenes NOT DETECTED NOT DETECTED Final   Staphylococcus species NOT DETECTED NOT DETECTED Final   Staphylococcus aureus (BCID) NOT DETECTED NOT DETECTED Final   Staphylococcus epidermidis NOT DETECTED NOT DETECTED Final   Staphylococcus lugdunensis NOT DETECTED NOT DETECTED Final   Streptococcus species NOT DETECTED NOT DETECTED Final   Streptococcus agalactiae NOT DETECTED NOT DETECTED Final   Streptococcus pneumoniae NOT DETECTED NOT DETECTED Final   Streptococcus pyogenes NOT DETECTED NOT DETECTED Final   A.calcoaceticus-baumannii NOT DETECTED NOT DETECTED Final   Bacteroides fragilis NOT DETECTED NOT DETECTED Final   Enterobacterales NOT DETECTED NOT DETECTED Final   Enterobacter cloacae complex NOT DETECTED NOT DETECTED Final   Escherichia coli NOT DETECTED NOT DETECTED Final   Klebsiella aerogenes NOT DETECTED NOT DETECTED Final   Klebsiella oxytoca NOT DETECTED NOT DETECTED Final   Klebsiella pneumoniae NOT DETECTED NOT DETECTED Final   Proteus species NOT DETECTED NOT DETECTED Final   Salmonella species NOT DETECTED NOT DETECTED Final   Serratia marcescens NOT DETECTED  NOT DETECTED Final   Haemophilus influenzae NOT DETECTED NOT DETECTED Final   Neisseria meningitidis NOT DETECTED NOT DETECTED Final   Pseudomonas aeruginosa NOT DETECTED NOT DETECTED Final   Stenotrophomonas maltophilia NOT DETECTED NOT DETECTED Final   Candida albicans NOT DETECTED NOT DETECTED Final   Candida auris NOT DETECTED NOT DETECTED Final   Candida glabrata NOT DETECTED NOT DETECTED Final   Candida krusei NOT DETECTED NOT DETECTED Final   Candida parapsilosis NOT DETECTED NOT DETECTED Final   Candida tropicalis NOT DETECTED NOT DETECTED Final   Cryptococcus neoformans/gattii NOT DETECTED NOT DETECTED Final    Comment: Performed at [facility], [address] [place], [place] [address]  Blood Culture (routine x 2)     Status: Abnormal   Collection Time: 06/05/22 10:52 AM   Specimen: BLOOD  Result Value Ref Range Status   Specimen Description   Final    BLOOD RIGHT ANTECUBITAL Performed at [facility], [address] [place], [place] [address]    Special Requests   Final    BOTTLES DRAWN AEROBIC AND ANAEROBIC Blood Culture results may not be optimal due to an excessive volume of blood received in culture bottles Performed at [facility], [address] [place], [place] [address]    Culture  Setup Time   Final    IN BOTH AEROBIC AND ANAEROBIC BOTTLES CRITICAL VALUE NOTED.  VALUE IS CONSISTENT WITH PREVIOUSLY REPORTED AND CALLED VALUE. GRAM NEGATIVE RODS    Culture (A)  Final  PASTEURELLA MULTOCIDA SUSCEPTIBILITIES PERFORMED ON PREVIOUS CULTURE WITHIN THE LAST 5 DAYS. Performed at [facility], [place] [address], [place], [place] [address]    Report Status 06/08/2022 FINAL  Final  Resp Panel by RT-PCR (Flu A&B, Covid) Anterior Nasal Swab     Status: None   Collection Time: 06/05/22 10:52 AM   Specimen: Anterior Nasal Swab  Result Value Ref Range Status   SARS Coronavirus 2 by RT PCR NEGATIVE NEGATIVE Final    Comment:  (NOTE) SARS-CoV-2 target nucleic acids are NOT DETECTED.  The SARS-CoV-2 RNA is generally detectable in upper respiratory specimens during the acute phase of infection. The lowest concentration of SARS-CoV-2 viral copies this assay can detect is 138 copies/mL. A negative result does not preclude SARS-Cov-2 infection and should not be used as the sole basis for treatment or other patient management decisions. A negative result may occur with  improper specimen collection/handling, submission of specimen other than nasopharyngeal swab, presence of viral mutation(s) within the areas targeted by this assay, and inadequate number of viral copies(<138 copies/mL). A negative result must be combined with clinical observations, patient history, and epidemiological information. The expected result is Negative.  Fact Sheet for Patients:  EntrepreneurPulse.com.au  Fact Sheet for Healthcare Providers:  IncredibleEmployment.be  This test is no t yet approved or cleared by the [place] FDA and  has been authorized for detection and/or diagnosis of SARS-CoV-2 by FDA under an Emergency Use Authorization (EUA). This EUA will remain  in effect (meaning this test can be used) for the duration of the COVID-19 declaration under Section 564(b)(1) of the Act, 21 U.S.C.section 360bbb-3(b)(1), unless the authorization is terminated  or revoked sooner.       Influenza A by PCR NEGATIVE NEGATIVE Final   Influenza B by PCR NEGATIVE NEGATIVE Final    Comment: (NOTE) The Xpert Xpress SARS-CoV-2/FLU/RSV plus assay is intended as an aid in the diagnosis of influenza from Nasopharyngeal swab specimens and should not be used as a sole basis for treatment. Nasal washings and aspirates are unacceptable for Xpert Xpress SARS-CoV-2/FLU/RSV testing.  Fact Sheet for Patients: EntrepreneurPulse.com.au  Fact Sheet for Healthcare  Providers: IncredibleEmployment.be  This test is not yet approved or cleared by the [place] FDA and has been authorized for detection and/or diagnosis of SARS-CoV-2 by FDA under an Emergency Use Authorization (EUA). This EUA will remain in effect (meaning this test can be used) for the duration of the COVID-19 declaration under Section 564(b)(1) of the Act, 21 U.S.C. section 360bbb-3(b)(1), unless the authorization is terminated or revoked.  Performed at [facility], [address] [place], [place] [address]   MRSA Next Gen by PCR, Nasal     Status: None   Collection Time: 06/06/22  3:31 PM   Specimen: Nasal Mucosa; Nasal Swab  Result Value Ref Range Status   MRSA by PCR Next Gen NOT DETECTED NOT DETECTED Final    Comment: (NOTE) The GeneXpert MRSA Assay (FDA approved for NASAL specimens only), is one component of a comprehensive MRSA colonization surveillance program. It is not intended to diagnose MRSA infection nor to guide or monitor treatment for MRSA infections. Test performance is not FDA approved in patients less than 41 years old. Performed at [facility], [address] [place], [place] [address]   Culture, blood (Routine X 2) w Reflex to ID Panel     Status: None   Collection Time: 06/08/22 11:01 AM   Specimen: BLOOD  Result Value  Ref Range Status   Specimen Description BLOOD BLOOD RIGHT HAND  Final   Special Requests   Final    BOTTLES DRAWN AEROBIC AND ANAEROBIC Blood Culture adequate volume   Culture   Final    NO GROWTH 5 DAYS Performed at [facility], [address] [place], [place] [address]    Report Status 06/13/2022 FINAL  Final  Culture, blood (Routine X 2) w Reflex to ID Panel     Status: None   Collection Time: 06/08/22 11:01 AM   Specimen: BLOOD  Result Value Ref Range Status   Specimen Description BLOOD BLOOD LEFT HAND  Final   Special Requests   Final    BOTTLES DRAWN AEROBIC AND  ANAEROBIC Blood Culture results may not be optimal due to an inadequate volume of blood received in culture bottles   Culture   Final    NO GROWTH 5 DAYS Performed at [facility], [address], [place], [place] [address]    Report Status 06/13/2022 FINAL  Final     Total time spend on discharging this patient, including the last patient exam, discussing the hospital stay, instructions for ongoing care as it relates to all pertinent caregivers, as well as preparing the medical discharge records, prescriptions, and/or referrals as applicable, is *** minutes.    Enzo Bi, MD  [facility] 06/13/2022, 1:30 PM
